# Patient Record
Sex: Female | Born: 2000 | Race: White | Hispanic: No | Smoking: Former smoker
Health system: Southern US, Community
[De-identification: ages and names within clinical notes are randomized; demographics above are authoritative.]

## PROBLEM LIST (undated history)

## (undated) DIAGNOSIS — J45909 Unspecified asthma, uncomplicated: Secondary | ICD-10-CM

## (undated) DIAGNOSIS — F32A Depression, unspecified: Secondary | ICD-10-CM

## (undated) DIAGNOSIS — F329 Major depressive disorder, single episode, unspecified: Secondary | ICD-10-CM

## (undated) DIAGNOSIS — T7840XA Allergy, unspecified, initial encounter: Secondary | ICD-10-CM

## (undated) DIAGNOSIS — O139 Gestational [pregnancy-induced] hypertension without significant proteinuria, unspecified trimester: Secondary | ICD-10-CM

## (undated) DIAGNOSIS — E079 Disorder of thyroid, unspecified: Secondary | ICD-10-CM

## (undated) DIAGNOSIS — G4730 Sleep apnea, unspecified: Secondary | ICD-10-CM

## (undated) DIAGNOSIS — E039 Hypothyroidism, unspecified: Secondary | ICD-10-CM

## (undated) DIAGNOSIS — F419 Anxiety disorder, unspecified: Secondary | ICD-10-CM

## (undated) HISTORY — DX: Sleep apnea, unspecified: G47.30

## (undated) HISTORY — DX: Gestational (pregnancy-induced) hypertension without significant proteinuria, unspecified trimester: O13.9

## (undated) HISTORY — DX: Hypothyroidism, unspecified: E03.9

---
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
## 2022-01-28 ENCOUNTER — Encounter: Payer: Self-pay | Admitting: Nurse Practitioner

## 2022-01-28 ENCOUNTER — Ambulatory Visit (INDEPENDENT_AMBULATORY_CARE_PROVIDER_SITE_OTHER): Payer: Medicaid Other | Admitting: Nurse Practitioner

## 2022-01-28 VITALS — BP 113/78 | HR 113 | Temp 98.8°F | Ht 63.0 in | Wt 277.0 lb

## 2022-01-28 DIAGNOSIS — J309 Allergic rhinitis, unspecified: Secondary | ICD-10-CM

## 2022-01-28 DIAGNOSIS — E039 Hypothyroidism, unspecified: Secondary | ICD-10-CM

## 2022-01-28 DIAGNOSIS — F322 Major depressive disorder, single episode, severe without psychotic features: Secondary | ICD-10-CM | POA: Diagnosis not present

## 2022-01-28 DIAGNOSIS — F419 Anxiety disorder, unspecified: Secondary | ICD-10-CM | POA: Diagnosis not present

## 2022-01-28 MED ORDER — BUSPIRONE HCL 7.5 MG PO TABS: 7.5000 mg | ORAL_TABLET | Freq: Two times a day (BID) | ORAL | 6 refills | Status: DC

## 2022-01-28 MED ORDER — ESCITALOPRAM OXALATE 20 MG PO TABS
20.0000 mg | ORAL_TABLET | Freq: Every day | ORAL | 5 refills | Status: DC
Start: 2022-01-28 — End: 2022-04-16

## 2022-01-28 MED ORDER — FLUTICASONE PROPIONATE 50 MCG/ACT NA SUSP: 1.0000 | Freq: Every day | NASAL | 5 refills | Status: AC

## 2022-01-28 NOTE — Assessment & Plan Note (Signed)
Depression not well controlled.  Completed PHQ-9.  Increase Lexapro from 10 mg tablet by mouth daily to 20 mg tablet by mouth daily.  Printed handouts given.  Follow-up in 6 to 12 weeks.

## 2022-01-28 NOTE — Assessment & Plan Note (Signed)
Completed TSH results pending.

## 2022-01-28 NOTE — Progress Notes (Signed)
Established Patient Office Visit  Subjective:  Patient ID: Joanne [name], female    DOB: [birth year]  Age: 21 y.o. MRN: [id number]  CC:  Chief Complaint  Patient presents with   Thyroid Problem    HPI Joanne [name] presents for Thyroid: Patient presents for evaluation of hypothyroidism. Current symptoms include fatigue, weight gain, anxiousness, depression. Patient denies constipation, feeling excessive energy, weight loss.    Anxiety, Follow-up  She was last seen for anxiety 4 months ago. Changes made at last visit include Lexapro 10 mg tablet by mouth, Busper 5 mg tablet  by mouth.   She reports good compliance with treatment. She reports good tolerance of treatment. She is not having side effects.  Anxiety not controlled  She feels her anxiety is severe and Worse since last visit.  Symptoms: No chest pain Yes difficulty concentrating  No dizziness Yes fatigue  Yes feelings of losing control Yes insomnia  Yes irritable No palpitations  Yes panic attacks No racing thoughts  No shortness of breath No sweating  No tremors/shakes    GAD-7 Results GAD-7 Generalized Anxiety Disorder Screening Tool 01/28/2022 10/17/2021 09/03/2021  1. Feeling Nervous, Anxious, or on Edge '3 3 3  ' 2. Not Being Able to Stop or Control Worrying '3 3 3  ' 3. Worrying Too Much About Different Things '3 3 3  ' 4. Trouble Relaxing '3 2 3  ' 5. Being So Restless it's Hard To Sit Still '1 2 3  ' 6. Becoming Easily Annoyed or Irritable '3 3 3  ' 7. Feeling Afraid As If Something Awful Might Happen '2 3 3  ' Total GAD-7 Score '18 19 21  ' Difficulty At Work, Home, or Getting  Along With Others? Extremely difficult Extremely difficult Somewhat difficult       Depression, Follow-up  She  was last seen for this 4 months ago. Changes made at last visit include Lexapro 10 mg tablet daily.   She reports good compliance with treatment. She is not having side effects.  Depression not well controlled  She reports good  tolerance of treatment. Current symptoms include: depressed mood, difficulty concentrating, fatigue, hopelessness, and insomnia She feels she is Worse since last visit.  Depression screen [facility] 2/9 01/28/2022 10/17/2021 09/03/2021  Decreased Interest '3 2 3  ' Down, Depressed, Hopeless '3 2 3  ' PHQ - 2 Score '6 4 6  ' Altered sleeping '2 3 3  ' Tired, decreased energy '3 2 3  ' Change in appetite '3 3 3  ' Feeling bad or failure about yourself  '2 2 2  ' Trouble concentrating '3 3 3  ' Moving slowly or fidgety/restless '1 1 3  ' Suicidal thoughts 0 0 2  PHQ-9 Score '20 18 25  ' Difficult doing work/chores Extremely dIfficult Very difficult Somewhat difficult  Some encounter information is confidential and restricted. Go to Review Flowsheets activity to see all data.      Past Medical History:  Diagnosis Date   Allergy    Anxiety    Asthma    Depression    Sleep apnea    Thyroid disease     Past Surgical History:  Procedure Laterality Date   BREAST REDUCTION SURGERY Bilateral 08/07/2020   Procedure: BILATERAL MAMMARY REDUCTION  (BREAST);  Surgeon: Cindra Presume, MD;  Location: [facility];  Service: Plastics;  Laterality: Bilateral;  2.5 hours, please    Family History  Problem Relation Age of Onset   Stroke Mother    Heart disease Mother    Asthma Mother    Arthritis  Mother    Anxiety disorder Mother    Alcohol abuse Mother    Drug abuse Mother    Hypertension Father    Alcohol abuse Father    Drug abuse Father    Bipolar disorder Sister    Drug abuse Sister    Depression Sister    Asthma Sister    Anxiety disorder Sister    Alcohol abuse Sister    ADD / ADHD Sister    Drug abuse Brother    Depression Brother    Alcohol abuse Brother    Drug abuse Maternal Grandfather    Cancer Maternal Grandfather        oral/throat   Alcohol abuse Maternal Grandfather    Drug abuse Maternal Grandmother    Cancer Maternal Grandmother        lung   Hyperlipidemia Paternal Grandfather     Diabetes Paternal Grandfather    Drug abuse Paternal Grandmother     Social History   Socioeconomic History   Marital status: Significant Other    Spouse name: Not on file   Number of children: 0   Years of education: 12   Highest education level: High school graduate  Occupational History   Not on file  Tobacco Use   Smoking status: Former    Packs/day: 1.00    Years: 4.00    Pack years: 4.00    Types: Cigarettes    Quit date: 2019    Years since quitting: 4.1   Smokeless tobacco: Never  Vaping Use   Vaping Use: Former   Quit date: 10/11/2021   Substances: Nicotine, Flavoring  Substance and Sexual Activity   Alcohol use: Not Currently   Drug use: Yes    Types: Marijuana    Comment: once or twice per week - 2 blunts per week   Sexual activity: Yes    Birth control/protection: Condom  Other Topics Concern   Not on file  Social History Narrative   Not on file   Social Determinants of Health   Financial Resource Strain: Not on file  Food Insecurity: Not on file  Transportation Needs: Not on file  Physical Activity: Not on file  Stress: Not on file  Social Connections: Not on file  Intimate Partner Violence: Not on file    Outpatient Medications Prior to Visit  Medication Sig Dispense Refill   albuterol (VENTOLIN HFA) 108 (90 Base) MCG/ACT inhaler Inhale 2 puffs into the lungs every 4 (four) hours as needed for wheezing or shortness of breath. 8 g 0   levocetirizine (XYZAL) 5 MG tablet TAKE 1 TABLET BY MOUTH DAILY 30 tablet 5   levothyroxine (SYNTHROID, LEVOTHROID) 50 MCG tablet Take 1 tablet by mouth daily.     norgestimate-ethinyl estradiol (ORTHO-CYCLEN) 0.25-35 MG-MCG tablet Take 1 tablet by mouth daily.     busPIRone (BUSPAR) 5 MG tablet Take 1 tablet (5 mg total) by mouth 2 (two) times daily. 30 tablet 0   escitalopram (LEXAPRO) 10 MG tablet Take 1 tablet (10 mg total) by mouth daily. 30 tablet 5   fluticasone (FLONASE) 50 MCG/ACT nasal spray Place 1  spray into both nostrils daily. 16 g 5   ondansetron (ZOFRAN) 4 MG tablet Take 1 tablet (4 mg total) by mouth every 8 (eight) hours as needed for nausea or vomiting. 20 tablet 0   No facility-administered medications prior to visit.    Allergies  Allergen Reactions   Red Dye Rash    ROS Review of Systems  Constitutional: Negative.   HENT: Negative.    Eyes: Negative.   Respiratory: Negative.    Cardiovascular: Negative.   Gastrointestinal: Negative.   Musculoskeletal: Negative.   Skin: Negative.   Psychiatric/Behavioral:  Negative for sleep disturbance and suicidal ideas. The patient is nervous/anxious.   All other systems reviewed and are negative.    Objective:    Physical Exam Vitals and nursing note reviewed.  Constitutional:      Appearance: She is obese.  HENT:     Head: Normocephalic.     Right Ear: External ear normal.     Left Ear: External ear normal.     Nose: Nose normal.     Mouth/Throat:     Mouth: Mucous membranes are moist.     Pharynx: Oropharynx is clear.  Eyes:     Conjunctiva/sclera: Conjunctivae normal.  Cardiovascular:     Pulses: Normal pulses.     Heart sounds: Normal heart sounds.  Pulmonary:     Effort: Pulmonary effort is normal.     Breath sounds: Normal breath sounds.  Abdominal:     General: Bowel sounds are normal.  Skin:    General: Skin is warm.     Findings: No rash.  Neurological:     Mental Status: She is oriented to person, place, and time.  Psychiatric:        Attention and Perception: Attention and perception normal.        Mood and Affect: Mood is anxious and depressed.        Speech: Speech normal.        Behavior: Behavior normal.    BP 113/78    Pulse (!) 113    Temp 98.8 F (37.1 C)    Ht '5\' 3"'  (1.6 m)    Wt 277 lb (125.6 kg)    LMP 12/26/2021 (Approximate)    SpO2 95%    BMI 49.07 kg/m  Wt Readings from Last 3 Encounters:  01/28/22 277 lb (125.6 kg) (>99 %, Z= 2.64)*  10/17/21 272 lb 12.8 oz (123.7 kg) (>99  %, Z= 2.60)*  09/03/21 277 lb 2 oz (125.7 kg) (>99 %, Z= 2.62)*   * Growth percentiles are based on CDC (Girls, 2-20 Years) data.     There are no preventive care reminders to display for this patient.  There are no preventive care reminders to display for this patient.  Lab Results  Component Value Date   TSH 4.181 02/01/2021   Lab Results  Component Value Date   WBC 10.1 09/03/2021   HGB 14.1 09/03/2021   HCT 42.0 09/03/2021   MCV 92 09/03/2021   PLT 336 09/03/2021   Lab Results  Component Value Date   NA 142 09/03/2021   K 4.1 09/03/2021   CO2 22 09/03/2021   GLUCOSE 71 09/03/2021   BUN 13 09/03/2021   CREATININE 0.59 09/03/2021   BILITOT 0.2 09/03/2021   ALKPHOS 93 09/03/2021   AST 21 09/03/2021   ALT 39 (H) 09/03/2021   PROT 7.3 09/03/2021   ALBUMIN 4.4 09/03/2021   CALCIUM 10.1 09/03/2021   ANIONGAP 12 02/01/2021   EGFR 134 09/03/2021   Lab Results  Component Value Date   CHOL 161 09/03/2021   Lab Results  Component Value Date   HDL 45 09/03/2021   Lab Results  Component Value Date   LDLCALC 93 09/03/2021   Lab Results  Component Value Date   TRIG 127 (H) 09/03/2021   Lab Results  Component Value  Date   CHOLHDL 3.6 09/03/2021   No results found for: HGBA1C    Assessment & Plan:   Problem List Items Addressed This Visit       Respiratory   Allergic rhinitis   Relevant Medications   fluticasone (FLONASE) 50 MCG/ACT nasal spray     Endocrine   Hypothyroidism - Primary    Completed TSH results pending.      Relevant Orders   TSH     Other   Anxiety    .Anxiety not well controlled increase BuSpar from 5 mg tablet by mouth daily to 7.5 mg tablet by mouth daily.  Education completed printed handouts given.  Complete the GAD-7.  Rx and pharmacy.  Follow-up in 6 to 12 weeks.      Relevant Medications   busPIRone (BUSPAR) 7.5 MG tablet   escitalopram (LEXAPRO) 20 MG tablet   Major depressive disorder, severe (HCC)    Depression  not well controlled.  Completed PHQ-9.  Increase Lexapro from 10 mg tablet by mouth daily to 20 mg tablet by mouth daily.  Printed handouts given.  Follow-up in 6 to 12 weeks.      Relevant Medications   busPIRone (BUSPAR) 7.5 MG tablet   escitalopram (LEXAPRO) 20 MG tablet    Meds ordered this encounter  Medications   fluticasone (FLONASE) 50 MCG/ACT nasal spray    Sig: Place 1 spray into both nostrils daily.    Dispense:  16 g    Refill:  5   busPIRone (BUSPAR) 7.5 MG tablet    Sig: Take 1 tablet (7.5 mg total) by mouth 2 (two) times daily.    Dispense:  30 tablet    Refill:  6    Order Specific Question:   Supervising Provider    Answer:   Claretta Fraise [[id number]]   escitalopram (LEXAPRO) 20 MG tablet    Sig: Take 1 tablet (20 mg total) by mouth daily.    Dispense:  30 tablet    Refill:  5    Order Specific Question:   Supervising Provider    Answer:   Claretta Fraise [id number]    Follow-up: Return in about 6 weeks (around 03/11/2022) for depression, 6 months follow up for Hypothyroid.    Ivy Lynn, NP

## 2022-01-28 NOTE — Assessment & Plan Note (Signed)
.  Anxiety not well controlled increase BuSpar from 5 mg tablet by mouth daily to 7.5 mg tablet by mouth daily.  Education completed printed handouts given.  Complete the GAD-7.  Rx and pharmacy.  Follow-up in 6 to 12 weeks.

## 2022-01-28 NOTE — Patient Instructions (Addendum)
Generalized Anxiety Disorder, Adult °Generalized anxiety disorder (GAD) is a mental health condition. Unlike normal worries, anxiety related to GAD is not triggered by a specific event. These worries do not fade or get better with time. GAD interferes with relationships, work, and school. °GAD symptoms can vary from mild to severe. People with severe GAD can have intense waves of anxiety with physical symptoms that are similar to panic attacks. °What are the causes? °The exact cause of GAD is not known, but the following are believed to have an impact: °Differences in natural brain chemicals. °Genes passed down from parents to children. °Differences in the way threats are perceived. °Development and stress during childhood. °Personality. °What increases the risk? °The following factors may make you more likely to develop this condition: °Being female. °Having a family history of anxiety disorders. °Being very shy. °Experiencing very stressful life events, such as the death of a loved one. °Having a very stressful family environment. °What are the signs or symptoms? °People with GAD often worry excessively about many things in their lives, such as their health and family. Symptoms may also include: °Mental and emotional symptoms: °Worrying excessively about natural disasters. °Fear of being late. °Difficulty concentrating. °Fears that others are judging your performance. °Physical symptoms: °Fatigue. °Headaches, muscle tension, muscle twitches, trembling, or feeling shaky. °Feeling like your heart is pounding or beating very fast. °Feeling out of breath or like you cannot take a deep breath. °Having trouble falling asleep or staying asleep, or experiencing restlessness. °Sweating. °Nausea, diarrhea, or irritable bowel syndrome (IBS). °Behavioral symptoms: °Experiencing erratic moods or irritability. °Avoidance of new situations. °Avoidance of people. °Extreme difficulty making decisions. °How is this diagnosed? °This  condition is diagnosed based on your symptoms and medical history. You will also have a physical exam. Your health care provider may perform tests to rule out other possible causes of your symptoms. °To be diagnosed with GAD, a person must have anxiety that: °Is out of his or her control. °Affects several different aspects of his or her life, such as work and relationships. °Causes distress that makes him or her unable to take part in normal activities. °Includes at least three symptoms of GAD, such as restlessness, fatigue, trouble concentrating, irritability, muscle tension, or sleep problems. °Before your health care provider can confirm a diagnosis of GAD, these symptoms must be present more days than they are not, and they must last for 6 months or longer. °How is this treated? °This condition may be treated with: °Medicine. Antidepressant medicine is usually prescribed for long-term daily control. Anti-anxiety medicines may be added in severe cases, especially when panic attacks occur. °Talk therapy (psychotherapy). Certain types of talk therapy can be helpful in treating GAD by providing support, education, and guidance. Options include: °Cognitive behavioral therapy (CBT). People learn coping skills and self-calming techniques to ease their physical symptoms. They learn to identify unrealistic thoughts and behaviors and to replace them with more appropriate thoughts and behaviors. °Acceptance and commitment therapy (ACT). This treatment teaches people how to be mindful as a way to cope with unwanted thoughts and feelings. °Biofeedback. This process trains you to manage your body's response (physiological response) through breathing techniques and relaxation methods. You will work with a therapist while machines are used to monitor your physical symptoms. °Stress management techniques. These include yoga, meditation, and exercise. °A mental health specialist can help determine which treatment is best for you.  Some people see improvement with one type of therapy. However, other people require   a combination of therapies. Follow these instructions at home: Lifestyle Maintain a consistent routine and schedule. Anticipate stressful situations. Create a plan and allow extra time to work with your plan. Practice stress management or self-calming techniques that you have learned from your therapist or your health care provider. Exercise regularly and spend time outdoors. Eat a healthy diet that includes plenty of vegetables, fruits, whole grains, low-fat dairy products, and lean protein. Do not eat a lot of foods that are high in fat, added sugar, or salt (sodium). Drink plenty of water. Avoid alcohol. Alcohol can increase anxiety. Avoid caffeine and certain over-the-counter cold medicines. These may make you feel worse. Ask your pharmacist which medicines to avoid. General instructions Take over-the-counter and prescription medicines only as told by your health care provider. Understand that you are likely to have setbacks. Accept this and be kind to yourself as you persist to take better care of yourself. Anticipate stressful situations. Create a plan and allow extra time to work with your plan. Recognize and accept your accomplishments, even if you judge them as small. Spend time with people who care about you. Keep all follow-up visits. This is important. Where to find more information [place]: https://carter.com/ Substance Abuse and Mental Health Services: ktimeonline.com Contact a health care provider if: Your symptoms do not get better. Your symptoms get worse. You have signs of depression, such as: A persistently sad or irritable mood. Loss of enjoyment in activities that used to bring you joy. Change in weight or eating. Changes in sleeping habits. Get help right away if: You have thoughts about hurting yourself or others. If you ever feel like you may hurt  yourself or others, or have thoughts about taking your own life, get help right away. Go to your nearest emergency department or: Call your local emergency services (911 in the [place] Call a suicide crisis helpline, such as the [place] at 469-035-7174 or 988 in the [place]. This is open 24 hours a day in the [place] Text the Crisis Text Line at 618-864-1192 (in the [place].). Summary Generalized anxiety disorder (GAD) is a mental health condition that involves worry that is not triggered by a specific event. People with GAD often worry excessively about many things in their lives, such as their health and family. GAD may cause symptoms such as restlessness, trouble concentrating, sleep problems, frequent sweating, nausea, diarrhea, headaches, and trembling or muscle twitching. A mental health specialist can help determine which treatment is best for you. Some people see improvement with one type of therapy. However, other people require a combination of therapies. This information is not intended to replace advice given to you by your health care provider. Make sure you discuss any questions you have with your health care provider. Document Revised: 07/04/2021 Document Reviewed: 04/01/2021 Elsevier Patient Education  [place]. Major Depressive Disorder, Adult Major depressive disorder (MDD) is a mental health condition. It may also be called clinical depression or unipolar depression. MDD causes symptoms of sadness, hopelessness, and loss of interest in things. These symptoms last most of the day, almost every day, for 2 weeks. MDD can also cause physical symptoms. It can interfere with relationships and with everyday activities, such as work, school, and activities that are usually pleasant. MDD may be mild, moderate, or severe. It may be single-episode MDD, which happens once, or recurrent MDD, which may occur multiple times. What are the causes? The exact cause of this  condition is  not known. MDD is most likely caused by a combination of things, which may include: Your personality traits. Learned or conditioned behaviors or thoughts or feelings that reinforce negativity. Any alcohol or substance misuse. Long-term (chronic) physical or mental health illness. Going through a traumatic experience or major life changes. What increases the risk? The following factors may make someone more likely to develop MDD: A family history of depression. Being a woman. Troubled family relationships. Abnormally low levels of certain brain chemicals. Traumatic or painful events in childhood, especially abuse or loss of a parent. A lot of stress from life experiences, such as poor living conditions or discrimination. Chronic physical illness or other mental health disorders. What are the signs or symptoms? The main symptoms of MDD usually include: Constant depressed or irritable mood. A loss of interest in things and activities. Other symptoms include: Sleeping or eating too much or too little. Unexplained weight gain or weight loss. Tiredness or low energy. Being agitated, restless, or weak. Feeling hopeless, worthless, or guilty. Trouble thinking clearly or making decisions. Thoughts of suicide or thoughts of harming others. Isolating oneself or avoiding other people or activities. Trouble completing tasks, work, or any normal obligations. Severe symptoms of this condition may include: Psychotic depression.This may include false beliefs, or delusions. It may also include seeing, hearing, tasting, smelling, or feeling things that are not real (hallucinations). Chronic depression or persistent depressive disorder. This is low-level depression that lasts for at least 2 years. Melancholic depression, or feeling extremely sad and hopeless. Catatonic depression, which includes trouble speaking and trouble moving. How is this diagnosed? This condition may be diagnosed  based on: Your symptoms. Your medical and mental health history. You may be asked questions about your lifestyle, including any drug and alcohol use. A physical exam. Blood tests to rule out other conditions. MDD is confirmed if you have the following symptoms most of the day, nearly every day, in a 2-week period: Either a depressed mood or loss of interest. At least four other MDD symptoms. How is this treated? This condition is usually treated by mental health professionals, such as psychologists, psychiatrists, and clinical social workers. You may need more than one type of treatment. Treatment may include: Psychotherapy, also called talk therapy or counseling. Types of psychotherapy include: Cognitive behavioral therapy (CBT). This teaches you to recognize unhealthy feelings, thoughts, and behaviors, and replace them with positive thoughts and actions. Interpersonal therapy (IPT). This helps you to improve the way you communicate with others or relate to them. Family therapy. This treatment includes members of your family. Medicines to treat anxiety and depression. These medicines help to balance the brain chemicals that affect your emotions. Lifestyle changes. You may be asked to: Limit alcohol use and avoid drug use. Get regular exercise. Get plenty of sleep. Make healthy eating choices. Spend more time outdoors. Brain stimulation. This may be done if symptoms are very severe and other treatments have not worked. Examples of this treatment are electroconvulsive therapy and transcranial magnetic stimulation. Follow these instructions at home: Activity Exercise regularly and spend time outdoors. Find activities that you enjoy doing, and make time to do them. Find healthy ways to manage stress, such as: Meditation or deep breathing. Spending time in nature. Journaling. Return to your normal activities as told by your health care provider. Ask your health care provider what activities  are safe for you. Alcohol and drug use If you drink alcohol: Limit how much you use to: 0-1 drink a day for  women who are not pregnant. 0-2 drinks a day for men. Be aware of how much alcohol is in your drink. In the [place] one drink equals one 12 oz bottle of beer (355 mL), one 5 oz glass of wine (148 mL), or one 1 oz glass of hard liquor (44 mL). Discuss your alcohol use with your health care provider. Alcohol can affect any antidepressant medicines you are taking. Discuss any drug use with your health care provider. General instructions  Take over-the-counter and prescription medicines only as told by your health care provider. Eat a healthy diet and get plenty of sleep. Consider joining a support group. Your health care provider may be able to recommend one. Keep all follow-up visits as told by your health care provider. This is important. Where to find more information [employer] on Mental Illness: www.nami.[place]: https://carter.com/ Contact a health care provider if: Your symptoms get worse. You develop new symptoms. Get help right away if: You self-harm. You have serious thoughts about hurting yourself or others. You hallucinate. If you ever feel like you may hurt yourself or others, or have thoughts about taking your own life, get help right away. Go to your nearest emergency department or: Call your local emergency services (911 in the [place] Call a suicide crisis helpline, such as the [place] at 367-246-2758 or 988 in the [place]. This is open 24 hours a day in the [place] Text the Crisis Text Line at 670-291-5766 (in the [place].). Summary Major depressive disorder (MDD) is a mental health condition. MDD causes symptoms of sadness, hopelessness, and loss of interest in things. These symptoms last most of the day, almost every day, for 2 weeks. The symptoms of MDD can interfere with relationships and with everyday  activities. Treatments and support are available for people who develop MDD. You may need more than one type of treatment. Get help right away if you have serious thoughts about hurting yourself or others. This information is not intended to replace advice given to you by your health care provider. Make sure you discuss any questions you have with your health care provider. Document Revised: 07/04/2021 Document Reviewed: 11/20/2019 Elsevier Patient Education  2022 [place]. Hypothyroidism Hypothyroidism is when the thyroid gland does not make enough of certain hormones (it is underactive). The thyroid gland is a small gland located in the lower front part of the neck, just in front of the windpipe (trachea). This gland makes hormones that help control how the body uses food for energy (metabolism) as well as how the heart and brain function. These hormones also play a role in keeping your bones strong. When the thyroid is underactive, it produces too little of the hormones thyroxine (T4) and triiodothyronine (T3). What are the causes? This condition may be caused by: Hashimoto's disease. This is a disease in which the body's disease-fighting system (immune system) attacks the thyroid gland. This is the most common cause. Viral infections. Pregnancy. Certain medicines. Birth defects. Past radiation treatments to the head or neck for cancer. Past treatment with radioactive iodine. Past exposure to radiation in the environment. Past surgical removal of part or all of the thyroid. Problems with a gland in the center of the brain (pituitary gland). Lack of enough iodine in the diet. What increases the risk? You are more likely to develop this condition if: You are female. You have a family history of thyroid conditions. You use a medicine called lithium. You  take medicines that affect the immune system (immunosuppressants). What are the signs or symptoms? Symptoms of this condition  include: Feeling as though you have no energy (lethargy). Not being able to tolerate cold. Weight gain that is not explained by a change in diet or exercise habits. Lack of appetite. Dry skin. Coarse hair. Menstrual irregularity. Slowing of thought processes. Constipation. Sadness or depression. How is this diagnosed? This condition may be diagnosed based on: Your symptoms, your medical history, and a physical exam. Blood tests. You may also have imaging tests, such as an ultrasound or MRI. How is this treated? This condition is treated with medicine that replaces the thyroid hormones that your body does not make. After you begin treatment, it may take several weeks for symptoms to go away. Follow these instructions at home: Take over-the-counter and prescription medicines only as told by your health care provider. If you start taking any new medicines, tell your health care provider. Keep all follow-up visits as told by your health care provider. This is important. As your condition improves, your dosage of thyroid hormone medicine may change. You will need to have blood tests regularly so that your health care provider can monitor your condition. Contact a health care provider if: Your symptoms do not get better with treatment. You are taking thyroid hormone replacement medicine and you: Sweat a lot. Have tremors. Feel anxious. Lose weight rapidly. Cannot tolerate heat. Have emotional swings. Have diarrhea. Feel weak. Get help right away if you have: Chest pain. An irregular heartbeat. A rapid heartbeat. Difficulty breathing. Summary Hypothyroidism is when the thyroid gland does not make enough of certain hormones (it is underactive). When the thyroid is underactive, it produces too little of the hormones thyroxine (T4) and triiodothyronine (T3). The most common cause is Hashimoto's disease, a disease in which the body's disease-fighting system (immune system) attacks the  thyroid gland. The condition can also be caused by viral infections, medicine, pregnancy, or past radiation treatment to the head or neck. Symptoms may include weight gain, dry skin, constipation, feeling as though you do not have energy, and not being able to tolerate cold. This condition is treated with medicine to replace the thyroid hormones that your body does not make. This information is not intended to replace advice given to you by your health care provider. Make sure you discuss any questions you have with your health care provider. Document Revised: 08/16/2021 Document Reviewed: 08/24/2020 Elsevier Patient Education  2022 [employer].

## 2022-01-29 ENCOUNTER — Other Ambulatory Visit: Payer: Self-pay | Admitting: Nurse Practitioner

## 2022-01-29 LAB — TSH: TSH: 1.93 u[IU]/mL (ref 0.450–4.500)

## 2022-01-29 MED ORDER — LEVOTHYROXINE SODIUM 50 MCG PO TABS: 50.0000 ug | ORAL_TABLET | Freq: Every day | ORAL | 1 refills | Status: DC

## 2022-01-31 ENCOUNTER — Telehealth: Payer: Self-pay | Admitting: *Deleted

## 2022-01-31 NOTE — Telephone Encounter (Signed)
Informed pt that I received information from her Aunt through her aunt's mychart but was not able to reply since this was about her. Not able to call the Aunt about this information since she is not listed on the patient's contact list even thought she has legal guardianship. Discussed with pt that ppw that was received about sleep information and needing CPAP machine, after discussion with Je her provider that in reading notes from [facility] and the severity of her OSA it would be in her best interest to follow-up with their recommendation of seeing Dr. Darrol Jump with [facility]. Pt verbalized understanding and will let her aunt know.

## 2022-02-16 ENCOUNTER — Other Ambulatory Visit: Payer: Self-pay | Admitting: Pediatrics

## 2022-02-25 ENCOUNTER — Other Ambulatory Visit: Payer: Self-pay | Admitting: Nurse Practitioner

## 2022-02-25 NOTE — Telephone Encounter (Signed)
Last office visit 01/28/22 ?On med list but never prescribed here before ?

## 2022-03-11 ENCOUNTER — Ambulatory Visit: Payer: Medicaid Other | Admitting: Nurse Practitioner

## 2022-04-09 ENCOUNTER — Ambulatory Visit: Payer: Medicaid Other | Admitting: Family Medicine

## 2022-04-12 ENCOUNTER — Ambulatory Visit: Payer: Medicaid Other | Admitting: Family Medicine

## 2022-04-16 ENCOUNTER — Encounter: Payer: Self-pay | Admitting: Family Medicine

## 2022-04-16 ENCOUNTER — Ambulatory Visit (INDEPENDENT_AMBULATORY_CARE_PROVIDER_SITE_OTHER): Payer: Medicaid Other | Admitting: Family Medicine

## 2022-04-16 ENCOUNTER — Other Ambulatory Visit (HOSPITAL_COMMUNITY)
Admission: RE | Admit: 2022-04-16 | Discharge: 2022-04-16 | Disposition: A | Discharge status: Home / Self Care | Payer: Medicaid Other | Source: Ambulatory Visit | Attending: Family Medicine | Admitting: Family Medicine

## 2022-04-16 VITALS — BP 111/66 | HR 86 | Temp 98.7°F | Ht 63.0 in | Wt 273.0 lb

## 2022-04-16 DIAGNOSIS — Z7251 High risk heterosexual behavior: Secondary | ICD-10-CM | POA: Diagnosis present

## 2022-04-16 DIAGNOSIS — Z3201 Encounter for pregnancy test, result positive: Secondary | ICD-10-CM

## 2022-04-16 LAB — PREGNANCY, URINE: Preg Test, Ur: POSITIVE — AB

## 2022-04-16 MED ORDER — PRENATAL + COMPLETE MULTI 0.267 & 373 MG PO THPK: 1.0000 | PACK | Freq: Every day | ORAL | 3 refills | Status: DC

## 2022-04-16 NOTE — Progress Notes (Signed)
?  ? ?Subjective:  ?Patient ID: Joanne [name], female    DOB: 12-Nov-2001, [age] y.o.   MRN: [id number] ? ?Patient Care Team: ?Daryll DrownIjaola, Onyeje M, NP as PCP - General (Nurse Practitioner) ?Brayton MarsWalsh, Marion Elizabeth Tharpe, MD (Pediatrics) ?Freddie ApleyBonner, Sheri N. as Speech Language Pathologist (Speech Pathology) ?Vella KohlerQayumi, Zainab S, MD as Referring Physician (Pediatrics)  ? ?Chief Complaint:  Possible Pregnancy ? ? ?HPI: ?Joanne [name] is a 21 y.o. female presenting on 04/16/2022 for Possible Pregnancy ? ? ?Pt presents today for pregnancy test. States her first day of LMP was 02/23/2022. She has taken 3 home pregnancy tests with positive results. She has stopped her lexapro and buspar. States she has been doing well since stopping medications.  ? ?Possible Pregnancy ?This is a new problem. Episode onset: First Day of LMP 02/23/2022. Associated symptoms include nausea. Pertinent negatives include no abdominal pain, anorexia, arthralgias, chest pain, chills, coughing, fatigue, fever, headaches, myalgias, urinary symptoms or vomiting. Associated symptoms comments: Missed menses, unprotected sex at least twice weekly. No vaginal discharge, pain, or bleeding.  ? ?Relevant past medical, surgical, family, and social history reviewed and updated as indicated.  ?Allergies and medications reviewed and updated. Data reviewed: Chart in Epic. ? ? ?Past Medical History:  ?Diagnosis Date  ? Allergy   ? Anxiety   ? Asthma   ? Depression   ? Sleep apnea   ? Thyroid disease   ? ? ?Past Surgical History:  ?Procedure Laterality Date  ? BREAST REDUCTION SURGERY Bilateral 08/07/2020  ? Procedure: BILATERAL MAMMARY REDUCTION  (BREAST);  Surgeon: Allena NapoleonPace, Collier S, MD;  Location: [facility];  Service: Plastics;  Laterality: Bilateral;  2.5 hours, please  ? ? ?Social History  ? ?Socioeconomic History  ? Marital status: Significant Other  ?  Spouse name: Not on file  ? Number of children: 0  ? Years of education: 6512  ? Highest education level: High  school graduate  ?Occupational History  ? Not on file  ?Tobacco Use  ? Smoking status: Former  ?  Packs/day: 1.00  ?  Years: 4.00  ?  Pack years: 4.00  ?  Types: Cigarettes  ?  Quit date: 2019  ?  Years since quitting: 4.3  ? Smokeless tobacco: Never  ?Vaping Use  ? Vaping Use: Former  ? Quit date: 10/11/2021  ? Substances: Nicotine, Flavoring  ?Substance and Sexual Activity  ? Alcohol use: Not Currently  ? Drug use: Yes  ?  Types: Marijuana  ?  Comment: once or twice per week - 2 blunts per week  ? Sexual activity: Yes  ?  Birth control/protection: Condom  ?Other Topics Concern  ? Not on file  ?Social History Narrative  ? Not on file  ? ?Social Determinants of Health  ? ?Financial Resource Strain: Not on file  ?Food Insecurity: Not on file  ?Transportation Needs: Not on file  ?Physical Activity: Not on file  ?Stress: Not on file  ?Social Connections: Not on file  ?Intimate Partner Violence: Not on file  ? ? ?Outpatient Encounter Medications as of 04/16/2022  ?Medication Sig  ? albuterol (VENTOLIN HFA) 108 (90 Base) MCG/ACT inhaler Inhale 2 puffs into the lungs every 4 (four) hours as needed for wheezing or shortness of breath.  ? fluticasone (FLONASE) 50 MCG/ACT nasal spray Place 1 spray into both nostrils daily.  ? levothyroxine (SYNTHROID) 50 MCG tablet Take 1 tablet (50 mcg total) by mouth daily.  ? Prenat-Methylfol-Chol-Fish Oil (PRENATAL + COMPLETE MULTI) 0.267 &  373 MG THPK Take 1 tablet by mouth daily.  ? [DISCONTINUED] busPIRone (BUSPAR) 7.5 MG tablet Take 1 tablet (7.5 mg total) by mouth 2 (two) times daily.  ? [DISCONTINUED] escitalopram (LEXAPRO) 20 MG tablet Take 1 tablet (20 mg total) by mouth daily.  ? [DISCONTINUED] levocetirizine (XYZAL) 5 MG tablet TAKE 1 TABLET BY MOUTH DAILY  ? [DISCONTINUED] norgestimate-ethinyl estradiol (ORTHO-CYCLEN) 0.25-35 MG-MCG tablet TAKE 1 TABLET BY MOUTH DAILY  ? ?No facility-administered encounter medications on file as of 04/16/2022.  ? ? ?Allergies  ?Allergen  Reactions  ? Red Dye Rash  ? ? ?Review of Systems  ?Constitutional:  Negative for activity change, appetite change, chills, fatigue and fever.  ?HENT: Negative.    ?Eyes: Negative.   ?Respiratory:  Negative for cough, chest tightness and shortness of breath.   ?Cardiovascular:  Negative for chest pain, palpitations and leg swelling.  ?Gastrointestinal:  Positive for nausea. Negative for abdominal pain, anorexia, blood in stool, constipation, diarrhea and vomiting.  ?Endocrine: Negative.   ?Genitourinary:  Positive for menstrual problem. Negative for decreased urine volume, difficulty urinating, dyspareunia, dysuria, enuresis, flank pain, frequency, genital sores, hematuria, pelvic pain, urgency, vaginal bleeding, vaginal discharge and vaginal pain.  ?Musculoskeletal:  Negative for arthralgias and myalgias.  ?Skin: Negative.   ?Allergic/Immunologic: Negative.   ?Neurological:  Negative for dizziness and headaches.  ?Hematological: Negative.   ?Psychiatric/Behavioral:  Negative for confusion, hallucinations, sleep disturbance and suicidal ideas.   ?All other systems reviewed and are negative. ? ?   ? ?Objective:  ?BP 111/66   Pulse 86   Temp 98.7 ?F (37.1 ?C)   Ht 5\' 3"  (1.6 m)   Wt 273 lb (123.8 kg)   LMP 03/31/2022 (Exact Date)   SpO2 95%   BMI 48.36 kg/m?   ? ?Wt Readings from Last 3 Encounters:  ?04/16/22 273 lb (123.8 kg) (>99 %, Z= 2.63)*  ?01/28/22 277 lb (125.6 kg) (>99 %, Z= 2.64)*  ?10/17/21 272 lb 12.8 oz (123.7 kg) (>99 %, Z= 2.60)*  ? ?* Growth percentiles are based on CDC (Girls, 2-20 Years) data.  ? ? ?Physical Exam ?Vitals and nursing note reviewed.  ?Constitutional:   ?   General: She is not in acute distress. ?   Appearance: Normal appearance. She is obese. She is not ill-appearing, toxic-appearing or diaphoretic.  ?HENT:  ?   Head: Normocephalic and atraumatic.  ?Eyes:  ?   Pupils: Pupils are equal, round, and reactive to light.  ?Cardiovascular:  ?   Rate and Rhythm: Normal rate and regular  rhythm.  ?   Heart sounds: Normal heart sounds. No murmur heard. ?  No friction rub. No gallop.  ?Pulmonary:  ?   Effort: Pulmonary effort is normal.  ?   Breath sounds: Normal breath sounds.  ?Skin: ?   General: Skin is warm and dry.  ?   Capillary Refill: Capillary refill takes less than 2 seconds.  ?Neurological:  ?   General: No focal deficit present.  ?   Mental Status: She is alert and oriented to person, place, and time.  ?Psychiatric:     ?   Mood and Affect: Mood normal.     ?   Behavior: Behavior normal.     ?   Thought Content: Thought content normal.     ?   Judgment: Judgment normal.  ? ? ?Results for orders placed or performed in visit on 01/28/22  ?TSH  ?Result Value Ref Range  ? TSH 1.930 0.450 -  4.500 uIU/mL  ? ?   ? ?Pertinent labs & imaging results that were available during my care of the patient were reviewed by me and considered in my medical decision making. ? ?Assessment & Plan:  ?Vaishali was seen today for possible pregnancy. ? ?Diagnoses and all orders for this visit: ? ?Unprotected sex ?Positive pregnancy test ?Pregnancy positive. Referral to OB placed. STI testing pending. Prenatal prescribed.  ?-     Pregnancy, urine ?-     Prenat-Methylfol-Chol-Fish Oil (PRENATAL + COMPLETE MULTI) 0.267 & 373 MG THPK; Take 1 tablet by mouth daily. ?-     Urine cytology ancillary only ?-     Ambulatory referral to Obstetrics / Gynecology ? ?  ? ?Continue all other maintenance medications. ? ?Follow up plan: ?Return if symptoms worsen or fail to improve. ? ? ?Continue healthy lifestyle choices, including diet (rich in fruits, vegetables, and lean proteins, and low in salt and simple carbohydrates) and exercise (at least 30 minutes of moderate physical activity daily). ? ?Educational handout given for safe sex practices ? ?The above assessment and management plan was discussed with the patient. The patient verbalized understanding of and has agreed to the management plan. Patient is aware to call the clinic  if they develop any new symptoms or if symptoms persist or worsen. Patient is aware when to return to the clinic for a follow-up visit. Patient educated on when it is appropriate to go to the emergency department.

## 2022-04-16 NOTE — Patient Instructions (Addendum)
7 weeks 3 days today per LMP ? ?Safe Medications in Pregnancy  ? ?Acne: ?Benzoyl Peroxide ?Salicylic Acid ? ?Backache/Headache: ?Tylenol: 2 regular strength every 4 hours OR ?             2 Extra strength every 6 hours ? ?Colds/Coughs/Allergies: ?Benadryl (alcohol free) 25 mg every 6 hours as needed ?Breath right strips ?Claritin ?Cepacol throat lozenges ?Chloraseptic throat spray ?Cold-Eeze- up to three times per day ?Cough drops, alcohol free ?Flonase (by prescription only) ?Guaifenesin ?Mucinex ?Robitussin DM (plain only, alcohol free) ?Saline nasal spray/drops ?Sudafed (pseudoephedrine) & Actifed ** use only after [redacted] weeks gestation and if you do not have high blood pressure ?Tylenol ?Vicks Vaporub ?Zinc lozenges ?Zyrtec  ? ?Constipation: ?Colace ?Ducolax suppositories ?Fleet enema ?Glycerin suppositories ?Metamucil ?Milk of magnesia ?Miralax ?Senokot ?Smooth move tea ? ?Diarrhea: ?Kaopectate ?Imodium A-D ? ?*NO pepto Bismol ? ?Hemorrhoids: ?Anusol ?Anusol HC ?Preparation H ?Tucks ? ?Indigestion: ?Tums ?Maalox ?Mylanta ?Zantac  ?Pepcid ?Nexium 24HR ? ?Insomnia: ?Benadryl (alcohol free) 25mg  every 6 hours as needed ?Tylenol PM ?Unisom, no Gelcaps ? ?Leg Cramps: ?Tums ?MagGel ? ?Nausea/Vomiting:  ?Bonine ?Dramamine ?Emetrol ?Ginger extract ?Sea bands ?Meclizine  ?Nausea medication to take during pregnancy:  ?Unisom (doxylamine succinate 25 mg tablets) Take one tablet daily at bedtime. If symptoms are not adequately controlled, the dose can be increased to a maximum recommended dose of two tablets daily (1/2 tablet in the morning, 1/2 tablet mid-afternoon and one at bedtime). ?Vitamin B6 100mg  tablets. Take one tablet twice a day (up to 200 mg per day). ? ?Skin Rashes: ?Aveeno products ?Benadryl cream or 25mg  every 6 hours as needed ?Calamine Lotion ?1% cortisone cream ? ?Yeast infection: ?Gyne-lotrimin 7 ?Monistat 7 ? ? ?**If taking multiple medications, please check labels to avoid duplicating the same active  ingredients ?**take medication as directed on the label ?** Do not exceed 4000 mg of tylenol in 24 hours ?**Do not take medications that contain aspirin or ibuprofen ? ? ? ?

## 2022-04-17 LAB — URINE CYTOLOGY ANCILLARY ONLY
Candida Urine: NEGATIVE
Chlamydia: NEGATIVE
Comment: NEGATIVE
Comment: NEGATIVE
Comment: NORMAL
Neisseria Gonorrhea: NEGATIVE
Trichomonas: NEGATIVE

## 2022-04-19 ENCOUNTER — Other Ambulatory Visit: Payer: Self-pay | Admitting: Obstetrics & Gynecology

## 2022-04-19 DIAGNOSIS — O3680X0 Pregnancy with inconclusive fetal viability, not applicable or unspecified: Secondary | ICD-10-CM

## 2022-04-22 ENCOUNTER — Ambulatory Visit (INDEPENDENT_AMBULATORY_CARE_PROVIDER_SITE_OTHER): Payer: Medicaid Other

## 2022-04-22 DIAGNOSIS — Z3A08 8 weeks gestation of pregnancy: Secondary | ICD-10-CM

## 2022-04-22 DIAGNOSIS — O3680X0 Pregnancy with inconclusive fetal viability, not applicable or unspecified: Secondary | ICD-10-CM

## 2022-04-22 NOTE — Progress Notes (Signed)
[place] 6+2 wks,single IUP with yolk sac,FHR 117 bpm,normal ovaries,CRL 5.5 mm ?

## 2022-05-14 ENCOUNTER — Other Ambulatory Visit: Payer: Self-pay | Admitting: Pediatrics

## 2022-05-14 DIAGNOSIS — J309 Allergic rhinitis, unspecified: Secondary | ICD-10-CM

## 2022-05-21 ENCOUNTER — Other Ambulatory Visit: Payer: Self-pay | Admitting: Nurse Practitioner

## 2022-05-21 DIAGNOSIS — J309 Allergic rhinitis, unspecified: Secondary | ICD-10-CM

## 2022-05-30 ENCOUNTER — Other Ambulatory Visit: Payer: Self-pay | Admitting: Obstetrics & Gynecology

## 2022-05-30 DIAGNOSIS — Z3682 Encounter for antenatal screening for nuchal translucency: Secondary | ICD-10-CM

## 2022-05-31 DIAGNOSIS — Z3400 Encounter for supervision of normal first pregnancy, unspecified trimester: Secondary | ICD-10-CM | POA: Insufficient documentation

## 2022-06-03 ENCOUNTER — Ambulatory Visit: Payer: Medicaid Other | Admitting: *Deleted

## 2022-06-03 ENCOUNTER — Ambulatory Visit (INDEPENDENT_AMBULATORY_CARE_PROVIDER_SITE_OTHER): Payer: Medicaid Other | Admitting: Advanced Practice Midwife

## 2022-06-03 ENCOUNTER — Ambulatory Visit (INDEPENDENT_AMBULATORY_CARE_PROVIDER_SITE_OTHER): Payer: Medicaid Other

## 2022-06-03 ENCOUNTER — Encounter: Payer: Self-pay | Admitting: Advanced Practice Midwife

## 2022-06-03 VITALS — BP 134/80 | HR 88 | Wt 275.0 lb

## 2022-06-03 DIAGNOSIS — Z3401 Encounter for supervision of normal first pregnancy, first trimester: Secondary | ICD-10-CM

## 2022-06-03 DIAGNOSIS — Z6841 Body Mass Index (BMI) 40.0 and over, adult: Secondary | ICD-10-CM

## 2022-06-03 DIAGNOSIS — F419 Anxiety disorder, unspecified: Secondary | ICD-10-CM

## 2022-06-03 DIAGNOSIS — Z3A12 12 weeks gestation of pregnancy: Secondary | ICD-10-CM

## 2022-06-03 DIAGNOSIS — Z3682 Encounter for antenatal screening for nuchal translucency: Secondary | ICD-10-CM | POA: Diagnosis not present

## 2022-06-03 DIAGNOSIS — F322 Major depressive disorder, single episode, severe without psychotic features: Secondary | ICD-10-CM

## 2022-06-03 DIAGNOSIS — E039 Hypothyroidism, unspecified: Secondary | ICD-10-CM

## 2022-06-03 LAB — POCT URINALYSIS DIPSTICK OB
Blood, UA: NEGATIVE
Glucose, UA: NEGATIVE
Ketones, UA: NEGATIVE
Leukocytes, UA: NEGATIVE
Nitrite, UA: NEGATIVE
POC,PROTEIN,UA: NEGATIVE

## 2022-06-03 MED ORDER — BLOOD PRESSURE MONITOR MISC: 0 refills | Status: DC

## 2022-06-03 NOTE — Progress Notes (Signed)
[place] 12+2 wks,measurement c/w,CRL 63.60 mm,normal ovaries,NT 1.5 mm,NB present,anterior placenta,FHR 158 bpm

## 2022-06-03 NOTE — Patient Instructions (Signed)
Joanne [name], I greatly value your feedback.  If you receive a survey following your visit with [place] today, we appreciate you taking the time to fill it out.  Thanks, Cathie Beams, DNP, CNM  [facility] HAS MOVED!!! It is now [facility] at [facility] ([address] [place], [place] [address]) Entrance located off of [address] Free 24/7 valet parking   Nausea & Vomiting Have saltine crackers or pretzels by your bed and eat a few bites before you raise your head out of bed in the morning Eat small frequent meals throughout the day instead of large meals Drink plenty of fluids throughout the day to stay hydrated, just don't drink a lot of fluids with your meals.  This can make your stomach fill up faster making you feel sick Do not brush your teeth right after you eat Products with real ginger are good for nausea, like ginger ale and ginger hard candy Make sure it says made with real ginger! Sucking on sour candy like lemon heads is also good for nausea If your prenatal vitamins make you nauseated, take them at night so you will sleep through the nausea Sea Bands If you feel like you need medicine for the nausea & vomiting please let us know If you are unable to keep any fluids or food down please let us know   Constipation Drink plenty of fluid, preferably water, throughout the day Eat foods high in fiber such as fruits, vegetables, and grains Exercise, such as walking, is a good way to keep your bowels regular Drink warm fluids, especially warm prune juice, or decaf coffee Eat a 1/2 cup of real oatmeal (not instant), 1/2 cup applesauce, and 1/2-1 cup warm prune juice every day If needed, you may take Colace (docusate sodium) stool softener once or twice a day to help keep the stool soft.  If you still are having problems with constipation, you may take Miralax once daily as needed to help keep your bowels regular.   Home Blood Pressure Monitoring for  Patients   Your provider has recommended that you check your blood pressure (BP) at least once a week at home. If you do not have a blood pressure cuff at home, one will be provided for you. Contact your provider if you have not received your monitor within 1 week.   Helpful Tips for Accurate Home Blood Pressure Checks  Don't smoke, exercise, or drink caffeine 30 minutes before checking your BP Use the restroom before checking your BP (a full bladder can raise your pressure) Relax in a comfortable upright chair Feet on the ground Left arm resting comfortably on a flat surface at the level of your heart Legs uncrossed Back supported Sit quietly and don't talk Place the cuff on your bare arm Adjust snuggly, so that only two fingertips can fit between your skin and the top of the cuff Check 2 readings separated by at least one minute Keep a log of your BP readings For a visual, please reference this diagram: http://ccnc.care/bpdiagram  Provider Name: [facility]     Phone: [phone]  Zone 1: ALL CLEAR  Continue to monitor your symptoms:  BP reading is less than 140 (top number) or less than 90 (bottom number)  No right upper stomach pain No headaches or seeing spots No feeling nauseated or throwing up No swelling in face and hands  Zone 2: CAUTION Call your doctor's office for any of the following:  BP  reading is greater than 140 (top number) or greater than 90 (bottom number)  Stomach pain under your ribs in the middle or right side Headaches or seeing spots Feeling nauseated or throwing up Swelling in face and hands  Zone 3: EMERGENCY  Seek immediate medical care if you have any of the following:  BP reading is greater than160 (top number) or greater than 110 (bottom number) Severe headaches not improving with Tylenol Serious difficulty catching your breath Any worsening symptoms from Zone 2    First Trimester of Pregnancy The first trimester of pregnancy is from  week 1 until the end of week 12 (months 1 through 3). A week after a sperm fertilizes an egg, the egg will implant on the wall of the uterus. This embryo will begin to develop into a baby. Genes from you and your partner are forming the baby. The female genes determine whether the baby is a boy or a girl. At 6-8 weeks, the eyes and face are formed, and the heartbeat can be seen on ultrasound. At the end of 12 weeks, all the baby's organs are formed.  Now that you are pregnant, you will want to do everything you can to have a healthy baby. Two of the most important things are to get good prenatal care and to follow your health care provider's instructions. Prenatal care is all the medical care you receive before the baby's birth. This care will help prevent, find, and treat any problems during the pregnancy and childbirth. BODY CHANGES Your body goes through many changes during pregnancy. The changes vary from woman to woman.  You may gain or lose a couple of pounds at first. You may feel sick to your stomach (nauseous) and throw up (vomit). If the vomiting is uncontrollable, call your health care provider. You may tire easily. You may develop headaches that can be relieved by medicines approved by your health care provider. You may urinate more often. Painful urination may mean you have a bladder infection. You may develop heartburn as a result of your pregnancy. You may develop constipation because certain hormones are causing the muscles that push waste through your intestines to slow down. You may develop hemorrhoids or swollen, bulging veins (varicose veins). Your breasts may begin to grow larger and become tender. Your nipples may stick out more, and the tissue that surrounds them (areola) may become darker. Your gums may bleed and may be sensitive to brushing and flossing. Dark spots or blotches (chloasma, mask of pregnancy) may develop on your face. This will likely fade after the baby is  born. Your menstrual periods will stop. You may have a loss of appetite. You may develop cravings for certain kinds of food. You may have changes in your emotions from day to day, such as being excited to be pregnant or being concerned that something may go wrong with the pregnancy and baby. You may have more vivid and strange dreams. You may have changes in your hair. These can include thickening of your hair, rapid growth, and changes in texture. Some women also have hair loss during or after pregnancy, or hair that feels dry or thin. Your hair will most likely return to normal after your baby is born. WHAT TO EXPECT AT YOUR PRENATAL VISITS During a routine prenatal visit: You will be weighed to make sure you and the baby are growing normally. Your blood pressure will be taken. Your abdomen will be measured to track your baby's growth. The fetal heartbeat  will be listened to starting around week 10 or 12 of your pregnancy. Test results from any previous visits will be discussed. Your health care provider may ask you: How you are feeling. If you are feeling the baby move. If you have had any abnormal symptoms, such as leaking fluid, bleeding, severe headaches, or abdominal cramping. If you have any questions. Other tests that may be performed during your first trimester include: Blood tests to find your blood type and to check for the presence of any previous infections. They will also be used to check for low iron levels (anemia) and Rh antibodies. Later in the pregnancy, blood tests for diabetes will be done along with other tests if problems develop. Urine tests to check for infections, diabetes, or protein in the urine. An ultrasound to confirm the proper growth and development of the baby. An amniocentesis to check for possible genetic problems. Fetal screens for spina bifida and Down syndrome. You may need other tests to make sure you and the baby are doing well. HOME CARE  INSTRUCTIONS  Medicines Follow your health care provider's instructions regarding medicine use. Specific medicines may be either safe or unsafe to take during pregnancy. Take your prenatal vitamins as directed. If you develop constipation, try taking a stool softener if your health care provider approves. Diet Eat regular, well-balanced meals. Choose a variety of foods, such as meat or vegetable-based protein, fish, milk and low-fat dairy products, vegetables, fruits, and whole grain breads and cereals. Your health care provider will help you determine the amount of weight gain that is right for you. Avoid raw meat and uncooked cheese. These carry germs that can cause birth defects in the baby. Eating four or five small meals rather than three large meals a day may help relieve nausea and vomiting. If you start to feel nauseous, eating a few soda crackers can be helpful. Drinking liquids between meals instead of during meals also seems to help nausea and vomiting. If you develop constipation, eat more high-fiber foods, such as fresh vegetables or fruit and whole grains. Drink enough fluids to keep your urine clear or pale yellow. Activity and Exercise Exercise only as directed by your health care provider. Exercising will help you: Control your weight. Stay in shape. Be prepared for labor and delivery. Experiencing pain or cramping in the lower abdomen or low back is a good sign that you should stop exercising. Check with your health care provider before continuing normal exercises. Try to avoid standing for long periods of time. Move your legs often if you must stand in one place for a long time. Avoid heavy lifting. Wear low-heeled shoes, and practice good posture. You may continue to have sex unless your health care provider directs you otherwise. Relief of Pain or Discomfort Wear a good support bra for breast tenderness.   Take warm sitz baths to soothe any pain or discomfort caused by  hemorrhoids. Use hemorrhoid cream if your health care provider approves.   Rest with your legs elevated if you have leg cramps or low back pain. If you develop varicose veins in your legs, wear support hose. Elevate your feet for 15 minutes, 3-4 times a day. Limit salt in your diet. Prenatal Care Schedule your prenatal visits by the twelfth week of pregnancy. They are usually scheduled monthly at first, then more often in the last 2 months before delivery. Write down your questions. Take them to your prenatal visits. Keep all your prenatal visits as directed by  your health care provider. Safety Wear your seat belt at all times when driving. Make a list of emergency phone numbers, including numbers for family, friends, the hospital, and police and fire departments. General Tips Ask your health care provider for a referral to a local prenatal education class. Begin classes no later than at the beginning of month 6 of your pregnancy. Ask for help if you have counseling or nutritional needs during pregnancy. Your health care provider can offer advice or refer you to specialists for help with various needs. Do not use hot tubs, steam rooms, or saunas. Do not douche or use tampons or scented sanitary pads. Do not cross your legs for long periods of time. Avoid cat litter boxes and soil used by cats. These carry germs that can cause birth defects in the baby and possibly loss of the fetus by miscarriage or stillbirth. Avoid all smoking, herbs, alcohol, and medicines not prescribed by your health care provider. Chemicals in these affect the formation and growth of the baby. Schedule a dentist appointment. At home, brush your teeth with a soft toothbrush and be gentle when you floss. SEEK MEDICAL CARE IF:  You have dizziness. You have mild pelvic cramps, pelvic pressure, or nagging pain in the abdominal area. You have persistent nausea, vomiting, or diarrhea. You have a bad smelling vaginal  discharge. You have pain with urination. You notice increased swelling in your face, hands, legs, or ankles. SEEK IMMEDIATE MEDICAL CARE IF:  You have a fever. You are leaking fluid from your vagina. You have spotting or bleeding from your vagina. You have severe abdominal cramping or pain. You have rapid weight gain or loss. You vomit blood or material that looks like coffee grounds. You are exposed to [place] measles and have never had them. You are exposed to fifth disease or chickenpox. You develop a severe headache. You have shortness of breath. You have any kind of trauma, such as from a fall or a car accident. Document Released: 12/03/2001 Document Revised: 04/25/2014 Document Reviewed: 10/19/2013 [facility] Patient Information 2015 [place], [place]. This information is not intended to replace advice given to you by your health care provider. Make sure you discuss any questions you have with your health care provider.  Coronavirus (COVID-19) Are you at risk?  Are you at risk for the Coronavirus (COVID-19)?  To be considered HIGH RISK for Coronavirus (COVID-19), you have to meet the following criteria:  Traveled to [place], [place], [place], [place] or [place];  and have fever, cough, and shortness of breath within the last 2 weeks of travel OR Been in close contact with a person diagnosed with COVID-19 within the last 2 weeks and have fever, cough, and shortness of breath IF YOU DO NOT MEET THESE CRITERIA, YOU ARE CONSIDERED LOW RISK FOR COVID-19.  What to do if you are HIGH RISK for COVID-19?  If you are having a medical emergency, call 911. Seek medical care right away. Before you go to a doctor's office, urgent care or emergency department, call ahead and tell them about your recent travel, contact with someone diagnosed with COVID-19, and your symptoms. You should receive instructions from your physician's office regarding next steps of care.  When you arrive at healthcare provider,  tell the healthcare staff immediately you have returned from visiting [place], [place], [place], [place] or [place]; in the last two weeks or you have been in close contact with a person diagnosed with COVID-19 in the last 2 weeks.  Tell the health care staff about your symptoms: fever, cough and shortness of breath. After you have been seen by a medical provider, you will be either: Tested for (COVID-19) and discharged home on quarantine except to seek medical care if symptoms worsen, and asked to  Stay home and avoid contact with others until you get your results (4-5 days)  Avoid travel on public transportation if possible (such as bus, train, or airplane) or Sent to the Emergency Department by EMS for evaluation, COVID-19 testing, and possible admission depending on your condition and test results.  What to do if you are LOW RISK for COVID-19?  Reduce your risk of any infection by using the same precautions used for avoiding the common cold or flu:  Wash your hands often with soap and warm water for at least 20 seconds.  If soap and water are not readily available, use an alcohol-based hand sanitizer with at least 60% alcohol.  If coughing or sneezing, cover your mouth and nose by coughing or sneezing into the elbow areas of your shirt or coat, into a tissue or into your sleeve (not your hands). Avoid shaking hands with others and consider head nods or verbal greetings only. Avoid touching your eyes, nose, or mouth with unwashed hands.  Avoid close contact with people who are sick. Avoid places or events with large numbers of people in one location, like concerts or sporting events. Carefully consider travel plans you have or are making. If you are planning any travel outside or inside the [place], visit the CDC's Travelers' Health webpage for the latest health notices. If you have some symptoms but not all symptoms, continue to monitor at home and seek medical attention if your symptoms worsen. If  you are having a medical emergency, call 911.   ADDITIONAL HEALTHCARE OPTIONS FOR [place] / e-Visit: [email or web address]         [facility]: [facility]: [id number]                   [facility]: [phone]     Safe Medications in Pregnancy   Acne: Benzoyl Peroxide Salicylic Acid  Backache/Headache: Tylenol: 2 regular strength every 4 hours OR              2 Extra strength every 6 hours  Colds/Coughs/Allergies: Benadryl (alcohol free) 25 mg every 6 hours as needed Breath right strips Claritin Cepacol throat lozenges Chloraseptic throat spray Cold-Eeze- up to three times per day Cough drops, alcohol free Flonase (by prescription only) Guaifenesin Mucinex Robitussin DM (plain only, alcohol free) Saline nasal spray/drops Sudafed (pseudoephedrine) & Actifed ** use only after [redacted] weeks gestation and if you do not have high blood pressure Tylenol Vicks Vaporub Zinc lozenges Zyrtec   Constipation: Colace Ducolax suppositories Fleet enema Glycerin suppositories Metamucil Milk of magnesia Miralax Senokot Smooth move tea  Diarrhea: Kaopectate Imodium A-D  *NO pepto Bismol  Hemorrhoids: Anusol Anusol HC Preparation H Tucks  Indigestion: Tums Maalox Mylanta Zantac  Pepcid  Insomnia: Benadryl (alcohol free) 36m every 6 hours as needed Tylenol PM Unisom, no Gelcaps  Leg Cramps: Tums MagGel  Nausea/Vomiting:  Bonine Dramamine Emetrol Ginger extract Sea bands Meclizine  Nausea medication to take during pregnancy:  Unisom (doxylamine succinate 25 mg tablets) Take one tablet daily at bedtime. If symptoms are not adequately controlled, the dose can be increased to a maximum recommended dose of two tablets daily (1/2 tablet in  the morning, 1/2 tablet mid-afternoon and one at bedtime). Vitamin B6 160m tablets. Take one  tablet twice a day (up to 200 mg per day).  Skin Rashes: Aveeno products Benadryl cream or 250mevery 6 hours as needed Calamine Lotion 1% cortisone cream  Yeast infection: Gyne-lotrimin 7 Monistat 7   **If taking multiple medications, please check labels to avoid duplicating the same active ingredients **take medication as directed on the label ** Do not exceed 4000 mg of tylenol in 24 hours **Do not take medications that contain aspirin or ibuprofen

## 2022-06-03 NOTE — Progress Notes (Signed)
INITIAL OBSTETRICAL VISIT Patient name: Joanne [name] MRN [id number]  Date of birth: 2001-01-16 Chief Complaint:   Initial Prenatal Visit  History of Present Illness:   Joanne [name] is a 21 y.o. G1P0  female at [redacted]w[redacted]d by [place] at 6 weeks with an Estimated Date of Delivery: 12/14/22 being seen today for her initial obstetrical visit.   Her obstetrical history is significant for 1st baby.  Medical hx: depression/anxiety(on buspar, stopped lexapro when she found out she was pregnant.  Doing ok, sees PCP for meds.  Offered BH referral, accepted) hypothyroid on synthroid. .   Today she reports fatigue.     06/03/2022   10:50 AM 04/16/2022   12:20 PM 01/28/2022    8:46 AM 10/17/2021    4:44 PM 09/10/2021    3:26 PM  Depression screen PHQ 2/9  Decreased Interest 2 2 3 2    Down, Depressed, Hopeless 0 3 3 2    PHQ - 2 Score 2 5 6 4    Altered sleeping 1 3 2 3    Tired, decreased energy 2 3 3 2    Change in appetite 2 3 3 3    Feeling bad or failure about yourself  0 1 2 2    Trouble concentrating 2 3 3 3    Moving slowly or fidgety/restless 1 3 1 1    Suicidal thoughts 0 0 0 0   PHQ-9 Score 10 21 20 18    Difficult doing work/chores  Very difficult Extremely dIfficult Very difficult      Information is confidential and restricted. Go to Review Flowsheets to unlock data.    Patient's last menstrual period was 02/23/2022 (approximate). Last pap n/a. Review of Systems:   Pertinent items are noted in HPI Denies cramping/contractions, leakage of fluid, vaginal bleeding, abnormal vaginal discharge w/ itching/odor/irritation, headaches, visual changes, shortness of breath, chest pain, abdominal pain, severe nausea/vomiting, or problems with urination or bowel movements unless otherwise stated above.  Pertinent History Reviewed:  Reviewed past medical,surgical, social, obstetrical and family history.  Reviewed problem list, medications and allergies. OB History  Gravida Para Term Preterm AB Living  1             SAB IAB Ectopic Multiple Live Births               # Outcome Date GA Lbr Len/2nd Weight Sex Delivery Anes PTL Lv  1 Current            Physical Assessment:   Vitals:   06/03/22 1051  BP: 134/80  Pulse: 88  Weight: 275 lb (124.7 kg)  Body mass index is 48.71 kg/m.       Physical Examination:  General appearance - well appearing, and in no distress  Mental status - alert, oriented to person, place, and time  Psych:  She has a normal mood and affect  Skin - warm and dry, normal color, no suspicious lesions noted  Chest - effort normal  Heart - normal rate and regular rhythm  Abdomen - soft, nontender  Extremities:  No swelling or varicosities noted   TODAY'S NT 12+2 wks,measurement c/w,CRL 63.60 mm,normal ovaries,NT 1.5 mm,NB present,anterior placenta,FHR 158 bpm  Results for orders placed or performed in visit on 06/03/22 (from the past 24 hour(s))  POC Urinalysis Dipstick OB   Collection Time: 06/03/22 11:19 AM  Result Value Ref Range   Color, UA     Clarity, UA     Glucose, UA Negative Negative   Bilirubin, UA  Ketones, UA neg    Spec Grav, UA     Blood, UA neg    pH, UA     POC,PROTEIN,UA Negative Negative, Trace, Small (1+), Moderate (2+), Large (3+), 4+   Urobilinogen, UA     Nitrite, UA neg    Leukocytes, UA Negative Negative   Appearance     Odor      Assessment & Plan:  1) Low-Risk Pregnancy G1P0 at [redacted]w[redacted]d with an Estimated Date of Delivery: 12/14/22   2) Initial OB visit  3) Depression/anxiety:  OK to continue buspar. Referral to [facility] made Meds:  Meds ordered this encounter  Medications   Blood Pressure Monitor MISC    Sig: For regular home bp monitoring during pregnancy    Dispense:  1 each    Refill:  0    Z34.81 Please mail to patient    Initial labs obtained Continue prenatal vitamins Reviewed n/v relief measures and warning s/s to report Reviewed recommended weight gain based on pre-gravid BMI Encouraged well-balanced  diet Genetic & carrier screening discussed: requests Panorama, NT/IT, and Horizon , declines AFP Ultrasound discussed; fetal survey: requested CCNC completed> form faxed if has or is planning to apply for medicaid The nature of [facility] [employer] with multiple MDs and other Advanced Practice Providers was explained to patient; also emphasized that fellows, residents, and students are part of our team. Doesn't have a home bp cuff. Rx faxed. Check bp weekly, let us know if >140/90.        Scarlette Calico Cresenzo-Dishmon 12:24 PM

## 2022-06-04 LAB — TSH: TSH: 1.71 u[IU]/mL (ref 0.450–4.500)

## 2022-06-05 LAB — INTEGRATED 1
Crown Rump Length: 63.6 mm
Gest. Age on Collection Date: 12.6 weeks
Maternal Age at EDD: 19.6 yr
Nuchal Translucency (NT): 1.5 mm
Number of Fetuses: 1
PAPP-A Value: 777 ng/mL
Weight: 275 [lb_av]

## 2022-06-05 LAB — CBC/D/PLT+RPR+RH+ABO+RUBIGG...
Antibody Screen: NEGATIVE
Basophils Absolute: 0 x10E3/uL (ref 0.0–0.2)
Basos: 0 %
EOS (ABSOLUTE): 0 x10E3/uL (ref 0.0–0.4)
Eos: 0 %
HCV Ab: NONREACTIVE
HIV Screen 4th Generation wRfx: NONREACTIVE
Hematocrit: 38.7 % (ref 34.0–46.6)
Hemoglobin: 13.6 g/dL (ref 11.1–15.9)
Hepatitis B Surface Ag: NEGATIVE
Immature Grans (Abs): 0 x10E3/uL (ref 0.0–0.1)
Immature Granulocytes: 0 %
Lymphocytes Absolute: 2.1 x10E3/uL (ref 0.7–3.1)
Lymphs: 26 %
MCH: 32.5 pg (ref 26.6–33.0)
MCHC: 35.1 g/dL (ref 31.5–35.7)
MCV: 93 fL (ref 79–97)
Monocytes Absolute: 0.3 x10E3/uL (ref 0.1–0.9)
Monocytes: 4 %
Neutrophils Absolute: 5.6 x10E3/uL (ref 1.4–7.0)
Neutrophils: 70 %
Platelets: 289 x10E3/uL (ref 150–450)
RBC: 4.18 x10E6/uL (ref 3.77–5.28)
RDW: 13 % (ref 11.7–15.4)
RPR Ser Ql: NONREACTIVE
Rh Factor: NEGATIVE
Rubella Antibodies, IGG: 1.99 {index} (ref >0.99)
WBC: 8 x10E3/uL (ref 3.4–10.8)

## 2022-06-05 LAB — GC/CHLAMYDIA PROBE AMP
Chlamydia trachomatis, NAA: NEGATIVE
Neisseria Gonorrhoeae by PCR: NEGATIVE

## 2022-06-05 LAB — URINE CULTURE

## 2022-06-05 LAB — HCV INTERPRETATION

## 2022-06-05 LAB — HEMOGLOBIN A1C
Est. average glucose Bld gHb Est-mCnc: 94 mg/dL
Hgb A1c MFr Bld: 4.9 % (ref 4.8–5.6)

## 2022-06-07 NOTE — BH Specialist Note (Signed)
Integrated Behavioral Health via Telemedicine Visit  06/20/2022 Joanne [name] [id number]  Number of Integrated Behavioral Health Clinician visits: 1- Initial Visit  Session Start time: 1348   Session End time: 1441  Total time in minutes: 53   Referring Provider: Jacklyn Shell, CNM Patient/Family location: Home [facility] Provider location: [facility]  All persons participating in visit: Patient Joanne [name] and [facility] Brennyn Haisley   Types of Service: Individual psychotherapy and Telephone visit  I connected with Rebacca Baquero and/or Lateesha Neider's  n/a  via  Telephone or Engineer, civil (consulting)  (Video is Surveyor, mining) and verified that I am speaking with the correct person using two identifiers. Discussed confidentiality: Yes   I discussed the limitations of telemedicine and the availability of in person appointments.  Discussed there is a possibility of technology failure and discussed alternative modes of communication if that failure occurs.  I discussed that engaging in this telemedicine visit, they consent to the provision of behavioral healthcare and the services will be billed under their insurance.  Patient and/or legal guardian expressed understanding and consented to Telemedicine visit: Yes   Presenting Concerns: Patient and/or family reports the following symptoms/concerns: Worry that depression will increase in pregnancy; was taking Lexapro and Buspar prior to positive pregnancy. Pt plans to switch jobs soon Designer, industrial/product) due to pregnancy risks; concerned about ongoing low back pain. Pt used to write to cope; open to implementing self-coping strategy; has good social support.  Duration of problem: Current pregnancy; Severity of problem: moderate  Patient and/or Family's Strengths/Protective Factors: Social connections, Concrete supports in place (healthy food, safe environments,  etc.), Sense of purpose, and Physical Health (exercise, healthy diet, medication compliance, etc.)  Goals Addressed: Patient will:  Reduce symptoms of: anxiety, depression, and stress   Increase knowledge and/or ability of: coping skills and stress reduction   Demonstrate ability to: Increase healthy adjustment to current life circumstances and Increase adequate support systems for patient/family  Progress towards Goals: Ongoing  Interventions: Interventions utilized:  Mindfulness or Management consultant, Psychoeducation and/or Health Education, and Link to [employer] Standardized Assessments completed: Not Needed  Patient and/or Family Response: Patient agrees with treatment plan.   Assessment: Patient currently experiencing Major depressive disorder, recurrent, moderate and Psychosocial stress.   Patient may benefit from psychoeducation and brief therapeutic interventions regarding coping with symptoms of depression, anxiety with panic, life stress .  Plan: Follow up with behavioral health clinician on : Two weeks Behavioral recommendations:  -Continue taking prenatal vitamin daily -Accept referral to [facility] -CALM relaxation breathing exercise twice daily (morning; at bedtime with sleep sounds); as needed throughout the day. -Consider obtaining a new journal to use during pregnancy only, to document this new stage of life/life changes Referral(s): Integrated Art gallery manager (In Clinic) and [employer]:  [employer] [facility] [employer]  I discussed the assessment and treatment plan with the patient and/or parent/guardian. They were provided an opportunity to ask questions and all were answered. They agreed with the plan and demonstrated an understanding of the instructions.   They were advised to call back or seek an in-person evaluation if the symptoms worsen or if the condition fails to improve as anticipated.  Valetta Close [name], LCSW     06/03/2022   10:50 AM  04/16/2022   12:20 PM 01/28/2022    8:46 AM 10/17/2021    4:44 PM 09/10/2021    3:26 PM  Depression screen PHQ 2/9  Decreased  Interest 2 2 3 2    Down, Depressed, Hopeless 0 3 3 2    PHQ - 2 Score 2 5 6 4    Altered sleeping 1 3 2 3    Tired, decreased energy 2 3 3 2    Change in appetite 2 3 3 3    Feeling bad or failure about yourself  0 1 2 2    Trouble concentrating 2 3 3 3    Moving slowly or fidgety/restless 1 3 1 1    Suicidal thoughts 0 0 0 0   PHQ-9 Score 10 21 20 18    Difficult doing work/chores  Very difficult Extremely dIfficult Very difficult      Information is confidential and restricted. Go to Review Flowsheets to unlock data.      06/03/2022   10:50 AM 04/16/2022   12:21 PM 01/28/2022    8:46 AM 10/17/2021    4:45 PM  GAD 7 : Generalized Anxiety Score  Nervous, Anxious, on Edge 1 3 3 3   Control/stop worrying 1 3 3 3   Worry too much - different things 1 3 3 3   Trouble relaxing 2 2 3 2   Restless 0 1 1 2   Easily annoyed or irritable 3 3 3 3   Afraid - awful might happen 0 2 2 3   Total GAD 7 Score 8 17 18 19   Anxiety Difficulty  Very difficult Extremely difficult Extremely difficult

## 2022-06-10 ENCOUNTER — Ambulatory Visit: Payer: Medicaid Other | Admitting: Nurse Practitioner

## 2022-06-20 ENCOUNTER — Ambulatory Visit (INDEPENDENT_AMBULATORY_CARE_PROVIDER_SITE_OTHER): Payer: Medicaid Other | Admitting: Clinical

## 2022-06-20 DIAGNOSIS — F331 Major depressive disorder, recurrent, moderate: Secondary | ICD-10-CM

## 2022-06-20 NOTE — BH Specialist Note (Signed)
Pt did not arrive to video visit and did not answer the phone; Left HIPPA-compliant message to call back Lux Skilton from [facility] at  [phone] (Soraya Paquette's office).  ?; left MyChart message for patient.  ? ?

## 2022-06-20 NOTE — Patient Instructions (Addendum)
[facility] [address] [place], [place] [address] [phone] (main office) [phone] [facility] office)  [email or web address]  [email or web address]

## 2022-07-01 ENCOUNTER — Encounter: Payer: Medicaid Other | Admitting: Women's Health

## 2022-07-04 ENCOUNTER — Ambulatory Visit: Payer: Medicaid Other | Admitting: Clinical

## 2022-07-04 DIAGNOSIS — Z91199 Patient's noncompliance with other medical treatment and regimen due to unspecified reason: Secondary | ICD-10-CM

## 2022-07-09 ENCOUNTER — Ambulatory Visit (INDEPENDENT_AMBULATORY_CARE_PROVIDER_SITE_OTHER): Payer: Medicaid Other | Admitting: Nurse Practitioner

## 2022-07-09 ENCOUNTER — Encounter: Payer: Self-pay | Admitting: Nurse Practitioner

## 2022-07-09 DIAGNOSIS — B3731 Acute candidiasis of vulva and vagina: Secondary | ICD-10-CM

## 2022-07-09 NOTE — Progress Notes (Signed)
Virtual Visit  Note Due to COVID-19 pandemic this visit was conducted virtually. This visit type was conducted due to national recommendations for restrictions regarding the COVID-19 Pandemic (e.g. social distancing, sheltering in place) in an effort to limit this patient's exposure and mitigate transmission in our community. All issues noted in this document were discussed and addressed.  A physical exam was not performed with this format.  I connected with Joanne [name] on 07/09/22 at 4:00 pm  by telephone and verified that I am speaking with the correct person using two identifiers. Joanne [name] is currently located at home during visit. The provider, Daryll Drown, NP is located in their office at time of visit.  I discussed the limitations, risks, security and privacy concerns of performing an evaluation and management service by telephone and the availability of in person appointments. I also discussed with the patient that there may be a patient responsible charge related to this service. The patient expressed understanding and agreed to proceed.   History and Present Illness:  Vaginal Discharge The patient's primary symptoms include vaginal discharge. The patient's pertinent negatives include no genital rash or vaginal bleeding. This is a new problem. The current episode started in the past 7 days. The problem has been unchanged. The patient is experiencing no pain. Pertinent negatives include no abdominal pain, back pain, chills, constipation, dysuria, fever, flank pain, rash or vomiting. Nothing aggravates the symptoms. She has tried nothing for the symptoms. She is sexually active. It is unknown whether or not her partner has an STD. Contraceptive use: Patient is [redacted] weeks pregnant.      Review of Systems  Constitutional:  Negative for chills, fever and malaise/fatigue.  Gastrointestinal:  Negative for abdominal pain, constipation and vomiting.  Genitourinary:  Positive for vaginal  discharge. Negative for dysuria and flank pain.  Musculoskeletal:  Negative for back pain.  Skin: Negative.  Negative for itching and rash.  All other systems reviewed and are negative.    Observations/Objective: Tele visit patient not in distress.  Assessment and Plan: Patient is reporting vaginal discharge and itching.  She is [redacted] weeks pregnant.  I provided education to patient that vaginal discharge sometimes can be common during pregnancy but we will be better if patient can come to the lab and do a wet prep and urinalysis.  Patient verbalized understanding.  She is not able to come today but will be able to come on Friday.  Orders have been put in and will treat patient based on lab results lab.   Follow Up Instructions: Follow up with worsening unresolved symptoms    I discussed the assessment and treatment plan with the patient. The patient was provided an opportunity to ask questions and all were answered. The patient agreed with the plan and demonstrated an understanding of the instructions.   The patient was advised to call back or seek an in-person evaluation if the symptoms worsen or if the condition fails to improve as anticipated.  The above assessment and management plan was discussed with the patient. The patient verbalized understanding of and has agreed to the management plan. Patient is aware to call the clinic if symptoms persist or worsen. Patient is aware when to return to the clinic for a follow-up visit. Patient educated on when it is appropriate to go to the emergency department.   Time call ended: 4:12 PM  I provided 12 minutes of  non face-to-face time during this encounter.    Daryll Drown,  NP

## 2022-07-12 ENCOUNTER — Other Ambulatory Visit: Payer: Medicaid Other

## 2022-07-12 ENCOUNTER — Other Ambulatory Visit: Payer: Self-pay | Admitting: Nurse Practitioner

## 2022-07-12 DIAGNOSIS — B3731 Acute candidiasis of vulva and vagina: Secondary | ICD-10-CM

## 2022-07-12 LAB — WET PREP FOR TRICH, YEAST, CLUE
Clue Cell Exam: NEGATIVE
Trichomonas Exam: NEGATIVE
Yeast Exam: POSITIVE — AB

## 2022-07-12 LAB — URINALYSIS, ROUTINE W REFLEX MICROSCOPIC
Bilirubin, UA: NEGATIVE
Glucose, UA: NEGATIVE
Leukocytes,UA: NEGATIVE
Nitrite, UA: NEGATIVE
Protein,UA: NEGATIVE
RBC, UA: NEGATIVE
Specific Gravity, UA: 1.02 (ref 1.005–1.030)
Urobilinogen, Ur: 0.2 mg/dL (ref 0.2–1.0)
pH, UA: 7 (ref 5.0–7.5)

## 2022-07-12 LAB — MICROSCOPIC EXAMINATION: RBC, Urine: NONE SEEN /hpf (ref 0–2)

## 2022-07-12 MED ORDER — CLOTRIMAZOLE 2 % VA CREA: 1.0000 | TOPICAL_CREAM | Freq: Every day | VAGINAL | 0 refills | Status: DC

## 2022-07-13 IMAGING — DX DG CHEST 1V PORT
1 series · 1 of 1 positions shown · non-contrast
Comparison: None.

CLINICAL DATA: Short of breath

EXAM:
PORTABLE CHEST 1 VIEW

[chest ap]
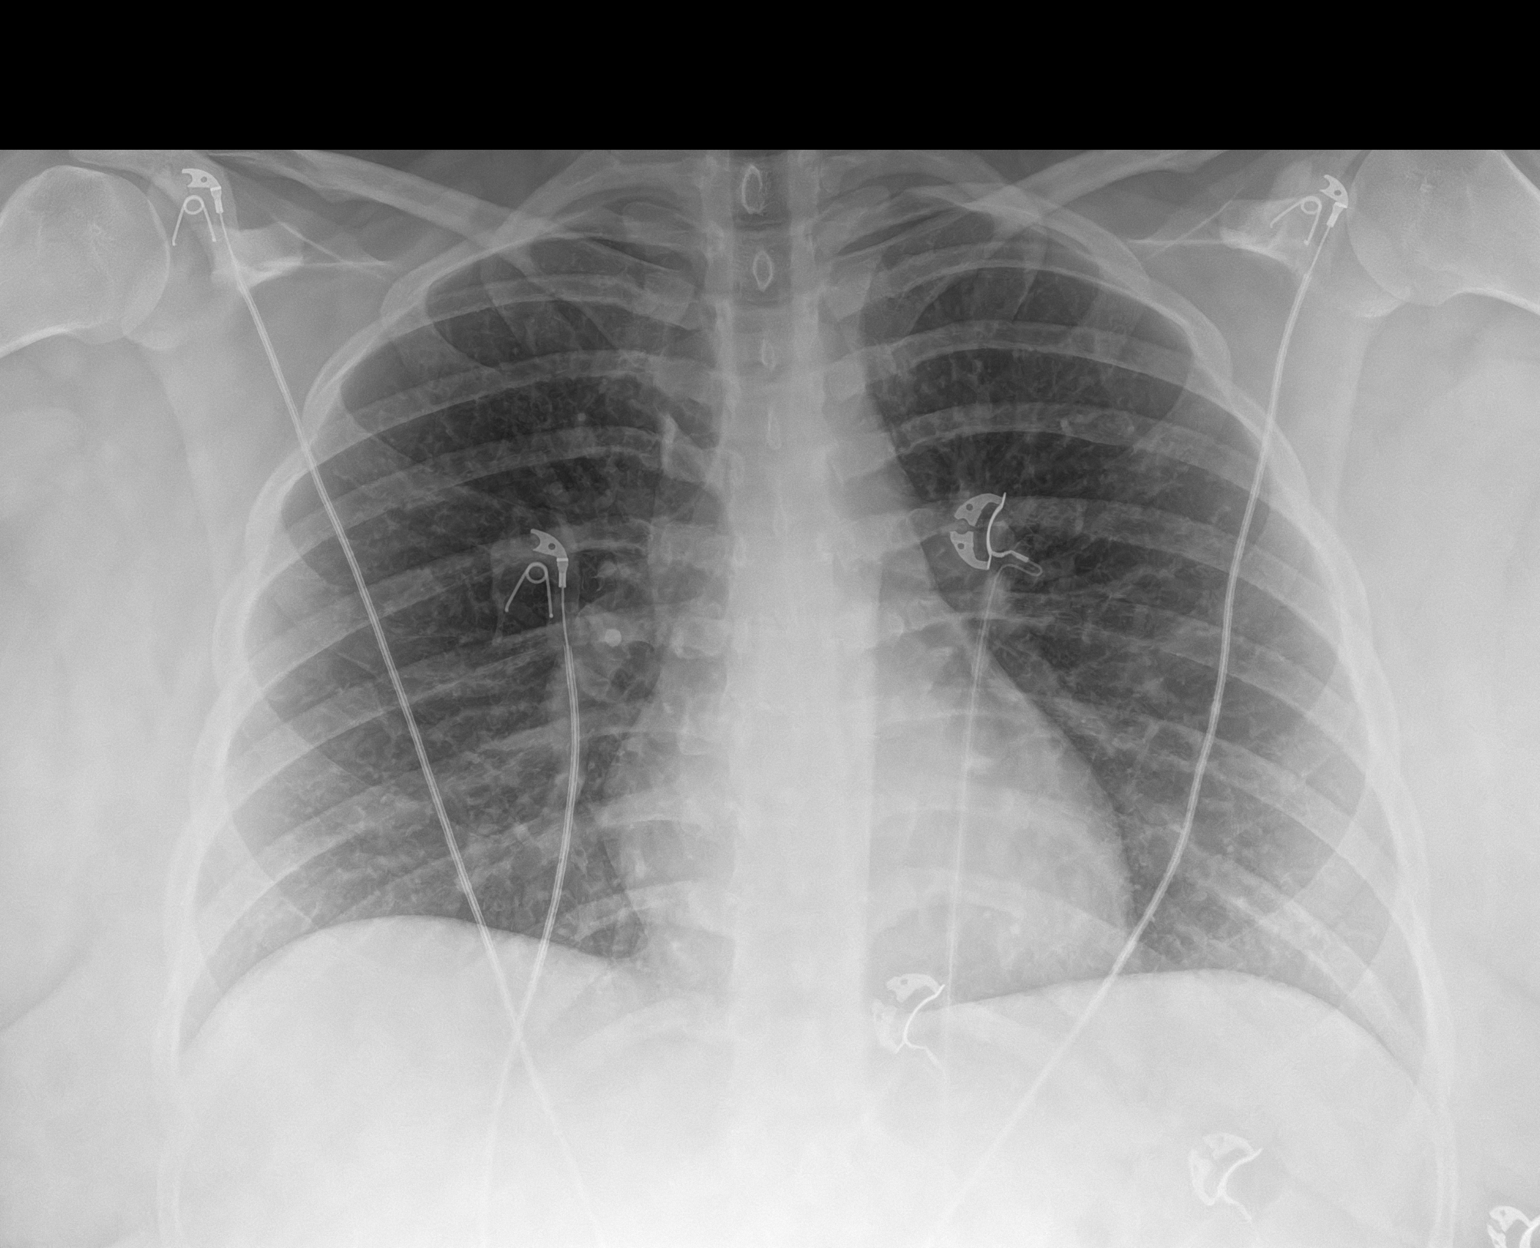

[1 of 1 positions shown; findings below may reference images not displayed]

FINDINGS: The heart size and mediastinal contours are within normal limits.
Both lungs are clear. The visualized skeletal structures are
unremarkable.
IMPRESSION: No active disease.

## 2022-07-14 ENCOUNTER — Other Ambulatory Visit: Payer: Self-pay | Admitting: Nurse Practitioner

## 2022-07-17 LAB — CT, NG, MYCOPLASMAS NAA, URINE
Chlamydia trachomatis, NAA: NEGATIVE
Mycoplasma genitalium NAA: NEGATIVE
Mycoplasma hominis NAA: POSITIVE — AB
Neisseria gonorrhoeae, NAA: NEGATIVE
Ureaplasma spp NAA: POSITIVE — AB

## 2022-07-24 ENCOUNTER — Other Ambulatory Visit: Payer: Self-pay | Admitting: Nurse Practitioner

## 2022-07-24 DIAGNOSIS — R8290 Unspecified abnormal findings in urine: Secondary | ICD-10-CM

## 2022-07-24 MED ORDER — AMOXICILLIN-POT CLAVULANATE 875-125 MG PO TABS: 1.0000 | ORAL_TABLET | Freq: Two times a day (BID) | ORAL | 0 refills | Status: DC

## 2022-07-30 ENCOUNTER — Other Ambulatory Visit: Payer: Self-pay | Admitting: Nurse Practitioner

## 2022-08-01 ENCOUNTER — Encounter: Payer: Medicaid Other | Admitting: Advanced Practice Midwife

## 2022-08-12 ENCOUNTER — Other Ambulatory Visit: Payer: Self-pay | Admitting: Family Medicine

## 2022-08-14 ENCOUNTER — Other Ambulatory Visit: Payer: Self-pay | Admitting: Nurse Practitioner

## 2022-08-15 ENCOUNTER — Encounter: Payer: Medicaid Other | Admitting: Advanced Practice Midwife

## 2022-08-23 ENCOUNTER — Encounter: Payer: Medicaid Other | Admitting: Obstetrics & Gynecology

## 2022-08-26 ENCOUNTER — Other Ambulatory Visit: Payer: Self-pay | Admitting: Nurse Practitioner

## 2022-09-05 ENCOUNTER — Ambulatory Visit (INDEPENDENT_AMBULATORY_CARE_PROVIDER_SITE_OTHER): Payer: Medicaid Other | Admitting: Advanced Practice Midwife

## 2022-09-05 ENCOUNTER — Encounter: Payer: Self-pay | Admitting: Advanced Practice Midwife

## 2022-09-05 VITALS — BP 99/62 | HR 81 | Wt 269.0 lb

## 2022-09-05 DIAGNOSIS — Z3A26 26 weeks gestation of pregnancy: Secondary | ICD-10-CM

## 2022-09-05 DIAGNOSIS — Z3402 Encounter for supervision of normal first pregnancy, second trimester: Secondary | ICD-10-CM

## 2022-09-05 DIAGNOSIS — Z6841 Body Mass Index (BMI) 40.0 and over, adult: Secondary | ICD-10-CM | POA: Insufficient documentation

## 2022-09-05 NOTE — Patient Instructions (Signed)

## 2022-09-05 NOTE — Progress Notes (Addendum)
   WORK-IN LOW-RISK PREGNANCY VISIT Patient name: Joanne [name] MRN [id number]  Date of birth: April 01, 2001 Chief Complaint:   Routine Prenatal Visit (Dog pushed her down yesterday; hasn't had any problems other than not feeling him kick like normal)  History of Present Illness:   Joanne [name] is a 21 y.o. G1P0 female at [redacted]w[redacted]d with an Estimated Date of Delivery: 12/14/22 being seen today for ongoing management of a low-risk pregnancy.  Today she reports knocked down by rottweiler yesterday, fell on side. Contractions: Not present. Vag. Bleeding: None.  Movement: (!) Decreased. denies leaking of fluid. Review of Systems:   Pertinent items are noted in HPI Denies abnormal vaginal discharge w/ itching/odor/irritation, headaches, visual changes, shortness of breath, chest pain, abdominal pain, severe nausea/vomiting, or problems with urination or bowel movements unless otherwise stated above. Pertinent History Reviewed:  Reviewed past medical,surgical, social, obstetrical and family history.  Reviewed problem list, medications and allergies. Physical Assessment:   Vitals:   09/05/22 1454  BP: 99/62  Pulse: 81  Weight: 269 lb (122 kg)  Body mass index is 47.65 kg/m.        Physical Examination:   General appearance: Well appearing, and in no distress  Mental status: Alert, oriented to person, place, and time  Skin: Warm & dry  Cardiovascular: Normal heart rate noted  Respiratory: Normal respiratory effort, no distress  Abdomen: Soft, gravid, nontender  Pelvic: Cervical exam deferred         Extremities: Edema: Trace  Fetal Status: Fetal Heart Rate (bpm): 150   Movement: (!) Decreased   fell yesterday on side, hasn't felt baby move today.  NO bleeding or pain  Chaperone: n/a    No results found for this or any previous visit (from the past 24 hour(s)).  Assessment & Plan:  1) Low-risk pregnancy G1P0 at [redacted]w[redacted]d with an Estimated Date of Delivery: 12/14/22   2) hypothyroid, continue  synthroid  3)  Arrhythmia heard today--refer to MFM   4) obesity BMI>40:  Growth [place] q 4 weeks; testing at 36; IOL 39-40 per MFM Meds: No orders of the defined types were placed in this encounter.  Labs/procedures today: doppler  Plan:  Continue routine obstetrical care Refer to MFM for arrhythmia Next visit: prefers in person    Reviewed: Preterm labor symptoms and general obstetric precautions including but not limited to vaginal bleeding, contractions, leaking of fluid and fetal movement were reviewed in detail with the patient.  All questions were answered. Has home bp cuff.. Check bp weekly, let us know if >140/90.   Follow-up: Return in about 2 weeks (around 09/19/2022) for PN2/LROB; growth [place] q 4 weeks, start w/first available [place] slot.    Orders Placed This Encounter  Procedures   US OB Follow Up   AMB referral to maternal fetal medicine   Jacklyn Shell DNP, CNM 09/26/2022 10:14 AM

## 2022-09-16 ENCOUNTER — Encounter: Payer: Self-pay | Admitting: Obstetrics & Gynecology

## 2022-09-16 ENCOUNTER — Ambulatory Visit (INDEPENDENT_AMBULATORY_CARE_PROVIDER_SITE_OTHER): Payer: Medicaid Other | Admitting: Obstetrics & Gynecology

## 2022-09-16 ENCOUNTER — Telehealth: Payer: Self-pay | Admitting: *Deleted

## 2022-09-16 VITALS — BP 118/70 | HR 84

## 2022-09-16 DIAGNOSIS — N760 Acute vaginitis: Secondary | ICD-10-CM

## 2022-09-16 DIAGNOSIS — B9689 Other specified bacterial agents as the cause of diseases classified elsewhere: Secondary | ICD-10-CM

## 2022-09-16 DIAGNOSIS — Z3402 Encounter for supervision of normal first pregnancy, second trimester: Secondary | ICD-10-CM

## 2022-09-16 DIAGNOSIS — I491 Atrial premature depolarization: Secondary | ICD-10-CM

## 2022-09-16 MED ORDER — NUVESSA 1.3 % VA GEL: 1.0000 | Freq: Once | VAGINAL | 1 refills | Status: AC

## 2022-09-16 NOTE — Progress Notes (Signed)
LOW-RISK PREGNANCY VISIT Patient name: Joanne [name] MRN [id number]  Date of birth: Feb 02, 2001 Chief Complaint:   Routine Prenatal Visit (Work in for spotting and decreased movement)  History of Present Illness:   Joanne [name] is a 21 y.o. G1P0 female at [redacted]w[redacted]d with an Estimated Date of Delivery: 12/14/22 being seen today for ongoing management of a low-risk pregnancy.     06/03/2022   10:50 AM 04/16/2022   12:20 PM 01/28/2022    8:46 AM 10/17/2021    4:44 PM 09/10/2021    3:26 PM  Depression screen PHQ 2/9  Decreased Interest 2 2 3 2    Down, Depressed, Hopeless 0 3 3 2    PHQ - 2 Score 2 5 6 4    Altered sleeping 1 3 2 3    Tired, decreased energy 2 3 3 2    Change in appetite 2 3 3 3    Feeling bad or failure about yourself  0 1 2 2    Trouble concentrating 2 3 3 3    Moving slowly or fidgety/restless 1 3 1 1    Suicidal thoughts 0 0 0 0   PHQ-9 Score 10 21 20 18    Difficult doing work/chores  Very difficult Extremely dIfficult Very difficult      Information is confidential and restricted. Go to Review Flowsheets to unlock data.    Today she reports had some spotting this am Has had discharge and odor for about 1 week consistently  . Contractions: Not present. Vag. Bleeding: Scant.   Movement: (!) Decreased. denies leaking of fluid. Review of Systems:   Pertinent items are noted in HPI Denies abnormal vaginal discharge w/ itching/odor/irritation, headaches, visual changes, shortness of breath, chest pain, abdominal pain, severe nausea/vomiting, or problems with urination or bowel movements unless otherwise stated above. Pertinent History Reviewed:  Reviewed past medical,surgical, social, obstetrical and family history.  Reviewed problem list, medications and allergies. Physical Assessment:   Vitals:   09/16/22 1003  BP: 118/70  Pulse: 84  There is no height or weight on file to calculate BMI.        Physical Examination:   General appearance: Well appearing, and in no  distress  Mental status: Alert, oriented to person, place, and time  Skin: Warm & dry  Cardiovascular: Normal heart rate noted  Respiratory: Normal respiratory effort, no distress  Abdomen: Soft, gravid, nontender  Pelvic: Cervical exam deferred         Extremities: Edema: None  SSE +BV on wet prep Cervix visually closed long Fetal Status:     Movement: (!) Decreased  -->has been moving  Chaperone: Amanda Rash    No results found for this or any previous visit (from the past 24 hour(s)).  Assessment & Plan:  1) Low-risk pregnancy G1P0 at [redacted]w[redacted]d with an Estimated Date of Delivery: 12/14/22     ICD-10-CM   1. BV (bacterial vaginosis)  N76.0    B96.89    e prescribed 1 dose, 1 refill    2. Encounter for supervision of normal first pregnancy in second trimester  Z34.02     3. Premature atrial contractions, benign, ECHO not indicated for this  I49.1         Meds:  Meds ordered this encounter  Medications   NUVESSA 1.3 % GEL    Sig: Place 1 applicator vaginally once for 1 dose.    Dispense:  5 g    Refill:  1   Labs/procedures today:   Plan:  Continue routine obstetrical  care  Next visit: prefers in person      Follow-up: Return for keep scheduled.  No orders of the defined types were placed in this encounter.   Florian Buff, MD 09/16/2022 10:38 AM

## 2022-09-16 NOTE — Telephone Encounter (Signed)
Patient called with c/o vaginal bleeding that started around 6am.  Noticed some clots but bleeding has now stopped.  Denies recent intercourse, heavy lifting and baby is moving. Has been having some mild cramping for a week. Advised patient to come in to office this morning for evaluation. Pt agreeable to plan.

## 2022-09-26 ENCOUNTER — Ambulatory Visit (INDEPENDENT_AMBULATORY_CARE_PROVIDER_SITE_OTHER): Payer: Medicaid Other | Admitting: Advanced Practice Midwife

## 2022-09-26 ENCOUNTER — Encounter: Payer: Self-pay | Admitting: Advanced Practice Midwife

## 2022-09-26 ENCOUNTER — Other Ambulatory Visit: Payer: Medicaid Other

## 2022-09-26 VITALS — BP 120/69 | HR 76 | Wt 266.0 lb

## 2022-09-26 DIAGNOSIS — Z23 Encounter for immunization: Secondary | ICD-10-CM | POA: Diagnosis not present

## 2022-09-26 DIAGNOSIS — Z3A28 28 weeks gestation of pregnancy: Secondary | ICD-10-CM

## 2022-09-26 DIAGNOSIS — Z3403 Encounter for supervision of normal first pregnancy, third trimester: Secondary | ICD-10-CM

## 2022-09-26 DIAGNOSIS — Z131 Encounter for screening for diabetes mellitus: Secondary | ICD-10-CM

## 2022-09-26 DIAGNOSIS — E039 Hypothyroidism, unspecified: Secondary | ICD-10-CM

## 2022-09-26 DIAGNOSIS — O26893 Other specified pregnancy related conditions, third trimester: Secondary | ICD-10-CM

## 2022-09-26 DIAGNOSIS — Z6791 Unspecified blood type, Rh negative: Secondary | ICD-10-CM | POA: Insufficient documentation

## 2022-09-26 NOTE — Progress Notes (Signed)
   LOW-RISK PREGNANCY VISIT Patient name: Joanne [name] MRN [id number]  Date of birth: 2001-03-02 Chief Complaint:   Routine Prenatal Visit (Needs refill on prenatal vitamins)  History of Present Illness:   Joanne [name] is a 21 y.o. G1P0 female at [redacted]w[redacted]d with an Estimated Date of Delivery: 12/14/22 being seen today for ongoing management of a low-risk pregnancy.  Today she reports no complaints. Contractions: Not present. Vag. Bleeding: None.  Movement: Present. denies leaking of fluid. Review of Systems:   Pertinent items are noted in HPI Denies abnormal vaginal discharge w/ itching/odor/irritation, headaches, visual changes, shortness of breath, chest pain, abdominal pain, severe nausea/vomiting, or problems with urination or bowel movements unless otherwise stated above. Pertinent History Reviewed:  Reviewed past medical,surgical, social, obstetrical and family history.  Reviewed problem list, medications and allergies. Physical Assessment:   Vitals:   09/26/22 0944  BP: 120/69  Pulse: 76  Weight: 266 lb (120.7 kg)  Body mass index is 47.12 kg/m.        Physical Examination:   General appearance: Well appearing, and in no distress  Mental status: Alert, oriented to person, place, and time  Skin: Warm & dry  Cardiovascular: Normal heart rate noted  Respiratory: Normal respiratory effort, no distress  Abdomen: Soft, gravid, nontender  Pelvic: Cervical exam deferred         Extremities: Edema: None  Fetal Status:     Movement: Present    Chaperone: n/a    No results found for this or any previous visit (from the past 24 hour(s)).  Assessment & Plan:  1) Low-risk pregnancy G1P0 at [redacted]w[redacted]d with an Estimated Date of Delivery: 12/14/22   2) insufficient PNC, missed 4 appts in July, august and September.   3) PACs heard at previous visit:  Dr. Elonda Husky cancelled MFM echo, FHR no longer w/PACs   Meds: No orders of the defined types were placed in this encounter.  Labs/procedures  today: PN2, TSH, Tdap, flu vaccine  Plan:  Continue routine obstetrical care  Next visit: prefers will be in person for anatomy scan     Reviewed: Preterm labor symptoms and general obstetric precautions including but not limited to vaginal bleeding, contractions, leaking of fluid and fetal movement were reviewed in detail with the patient.  All questions were answered. Has home bp cuff. . Check bp weekly, let us know if >140/90.   Follow-up: No follow-ups on file.  Future Appointments  Date Time Provider [place]  10/03/2022  8:30 AM [facility] [place] [facility] None  10/03/2022  9:50 AM Christin Fudge, CNM [facility]  10/31/2022  9:15 AM [facility] [place] [facility] None  11/28/2022  8:30 AM [facility] [place] [facility] None    Orders Placed This Encounter  Procedures  . Tdap vaccine greater than or equal to 7yo IM  . TSH   Christin Fudge DNP, CNM 09/26/2022 10:05 AM

## 2022-09-27 LAB — CBC
Hematocrit: 37.5 % (ref 34.0–46.6)
Hemoglobin: 12.9 g/dL (ref 11.1–15.9)
MCH: 32.4 pg (ref 26.6–33.0)
MCHC: 34.4 g/dL (ref 31.5–35.7)
MCV: 94 fL (ref 79–97)
Platelets: 239 10*3/uL (ref 150–450)
RBC: 3.98 x10E6/uL (ref 3.77–5.28)
RDW: 12 % (ref 11.7–15.4)
WBC: 8.5 10*3/uL (ref 3.4–10.8)

## 2022-09-27 LAB — TSH: TSH: 1.55 u[IU]/mL (ref 0.450–4.500)

## 2022-09-27 LAB — GLUCOSE TOLERANCE, 2 HOURS W/ 1HR
Glucose, 1 hour: 115 mg/dL (ref 70–179)
Glucose, 2 hour: 101 mg/dL (ref 70–152)
Glucose, Fasting: 67 mg/dL — ABNORMAL LOW (ref 70–91)

## 2022-09-27 LAB — HIV ANTIBODY (ROUTINE TESTING W REFLEX): HIV Screen 4th Generation wRfx: NONREACTIVE

## 2022-09-27 LAB — ANTIBODY SCREEN: Antibody Screen: NEGATIVE

## 2022-09-27 LAB — RPR: RPR Ser Ql: NONREACTIVE

## 2022-10-02 ENCOUNTER — Other Ambulatory Visit: Payer: Self-pay | Admitting: Advanced Practice Midwife

## 2022-10-02 DIAGNOSIS — Z6841 Body Mass Index (BMI) 40.0 and over, adult: Secondary | ICD-10-CM

## 2022-10-02 DIAGNOSIS — Z3402 Encounter for supervision of normal first pregnancy, second trimester: Secondary | ICD-10-CM

## 2022-10-02 DIAGNOSIS — Z363 Encounter for antenatal screening for malformations: Secondary | ICD-10-CM

## 2022-10-03 ENCOUNTER — Ambulatory Visit (INDEPENDENT_AMBULATORY_CARE_PROVIDER_SITE_OTHER): Payer: Medicaid Other | Admitting: Advanced Practice Midwife

## 2022-10-03 ENCOUNTER — Ambulatory Visit (INDEPENDENT_AMBULATORY_CARE_PROVIDER_SITE_OTHER): Payer: Medicaid Other

## 2022-10-03 DIAGNOSIS — Z3201 Encounter for pregnancy test, result positive: Secondary | ICD-10-CM

## 2022-10-03 DIAGNOSIS — Z3A29 29 weeks gestation of pregnancy: Secondary | ICD-10-CM

## 2022-10-03 DIAGNOSIS — Z363 Encounter for antenatal screening for malformations: Secondary | ICD-10-CM

## 2022-10-03 DIAGNOSIS — Z6791 Unspecified blood type, Rh negative: Secondary | ICD-10-CM

## 2022-10-03 DIAGNOSIS — Z6841 Body Mass Index (BMI) 40.0 and over, adult: Secondary | ICD-10-CM | POA: Diagnosis not present

## 2022-10-03 DIAGNOSIS — Z7251 High risk heterosexual behavior: Secondary | ICD-10-CM | POA: Diagnosis not present

## 2022-10-03 DIAGNOSIS — Z3403 Encounter for supervision of normal first pregnancy, third trimester: Secondary | ICD-10-CM

## 2022-10-03 DIAGNOSIS — Z3402 Encounter for supervision of normal first pregnancy, second trimester: Secondary | ICD-10-CM

## 2022-10-03 MED ORDER — PRENATAL + COMPLETE MULTI 0.267 & 373 MG PO THPK: 1.0000 | PACK | Freq: Every day | ORAL | 3 refills | Status: DC

## 2022-10-03 NOTE — Progress Notes (Signed)
[place] 23+7 wks,cephalic,cx 3.7 cm,anterior placenta gr 0,normal ovaries,AFI 16.7 cm,FHR 138 bpm,EFW 1578 g 64%,anatomy complete,no obvious abnormalities

## 2022-10-03 NOTE — Progress Notes (Signed)
   LOW-RISK PREGNANCY VISIT Patient name: Joanne [name] MRN [id number]  Date of birth: Jan 23, 2001 Chief Complaint:   Routine Prenatal Visit  History of Present Illness:   Joanne [name] is a 21 y.o. G1P0 female at [redacted]w[redacted]d with an Estimated Date of Delivery: 12/14/22 being seen today for ongoing management of a low-risk pregnancy.  Today she reports no complaints. Contractions: Not present. Vag. Bleeding: None.  Movement: Present. denies leaking of fluid. Review of Systems:   Pertinent items are noted in HPI Denies abnormal vaginal discharge w/ itching/odor/irritation, headaches, visual changes, shortness of breath, chest pain, abdominal pain, severe nausea/vomiting, or problems with urination or bowel movements unless otherwise stated above. Pertinent History Reviewed:  Reviewed past medical,surgical, social, obstetrical and family history.  Reviewed problem list, medications and allergies. Physical Assessment:   Vitals:   10/03/22 0913  BP: 116/74  Pulse: 85  Weight: 269 lb (122 kg)  Body mass index is 47.65 kg/m.        Physical Examination:   General appearance: Well appearing, and in no distress  Mental status: Alert, oriented to person, place, and time  Skin: Warm & dry  Cardiovascular: Normal heart rate noted  Respiratory: Normal respiratory effort, no distress  Abdomen: Soft, gravid, nontender  Pelvic: Cervical exam deferred         Extremities: Edema: None  Fetal Status: Fetal Heart Rate (bpm): [place]   Movement: Present  [place] 85+0 wks,cephalic,cx 3.7 cm,anterior placenta gr 0,normal ovaries,AFI 16.7 cm,FHR 138 bpm,EFW 1578 g 64%,anatomy complete,no obvious abnormalities   Chaperone: n/a    No results found for this or any previous visit (from the past 24 hour(s)).  Assessment & Plan:  1) Low-risk pregnancy G1P0 at [redacted]w[redacted]d with an Estimated Date of Delivery: 12/14/22   2) rh neg, rhogam today   Meds:  Meds ordered this encounter  Medications   Prenat-Methylfol-Chol-Fish Oil  (PRENATAL + COMPLETE MULTI) 0.267 & 373 MG THPK    Sig: Take 1 tablet by mouth daily.    Dispense:  90 each    Refill:  3    Order Specific Question:   Supervising Provider    Answer:   Florian Buff [[id number]]   Labs/procedures today: anatomy scan  Plan:  Continue routine obstetrical care  Next visit: prefers will be in person for [place]     Reviewed: Preterm labor symptoms and general obstetric precautions including but not limited to vaginal bleeding, contractions, leaking of fluid and fetal movement were reviewed in detail with the patient.  All questions were answered. Has home bp cuff.Check bp weekly, let us know if >140/90.   Follow-up: Return for add lrob appt to next 2 [place] appts.  Future Appointments  Date Time Provider [place]  10/03/2022  9:50 AM Christin Fudge, CNM [facility]  10/31/2022  9:15 AM [facility] [place] [facility] None  11/28/2022  8:30 AM [facility] [place] [facility] None    Orders Placed This Encounter  Procedures   RHO (D) Immune Globulin   Christin Fudge DNP, CNM 10/03/2022 9:25 AM

## 2022-10-03 NOTE — Patient Instructions (Signed)
Joanne [name], I greatly value your feedback.  If you receive a survey following your visit with [place] today, we appreciate you taking the time to fill it out.  Thanks, [name], DNP, CNM  [place]!!! It is now [place] at [facility] ([place], [place] [address]) Entrance located off of [address] parking   Go to [email or web address] to register for FREE online childbirth classes    Call the office [phone]) or go to [place] if: You begin to have strong, frequent contractions Your water breaks.  Sometimes it is a big gush of fluid, sometimes it is just a trickle that keeps getting your panties wet or running down your legs You have vaginal bleeding.  It is normal to have a small amount of spotting if your cervix was checked.  You don't feel your baby moving like normal.  If you don't, get you something to eat and drink and lay down and focus on feeling your baby move.  You should feel at least 10 movements in 2 hours.  If you don't, you should call the office or go to [place] Blood Pressure Monitoring for Patients   Your provider has recommended that you check your blood pressure (BP) at least once a week at home. If you do not have a blood pressure cuff at home, one will be provided for you. Contact your provider if you have not received your monitor within 1 week.   Helpful Tips for Accurate Home Blood Pressure Checks  Don't smoke, exercise, or drink caffeine 30 minutes before checking your BP Use the restroom before checking your BP (a full bladder can raise your pressure) Relax in a comfortable upright chair Feet on the ground Left arm resting comfortably on a flat surface at the level of your heart Legs uncrossed Back supported Sit quietly and don't talk Place the cuff on your bare arm Adjust snuggly, so that only two fingertips can fit between your skin and the top of  the cuff Check 2 readings separated by at least one minute Keep a log of your BP readings For a visual, please reference this diagram: http://ccnc.care/bpdiagram  Provider Name: [facility]     Phone: [phone]  Zone 1: ALL CLEAR  Continue to monitor your symptoms:  BP reading is less than 140 (top number) or less than 90 (bottom number)  No right upper stomach pain No headaches or seeing spots No feeling nauseated or throwing up No swelling in face and hands  Zone 2: CAUTION Call your doctor's office for any of the following:  BP reading is greater than 140 (top number) or greater than 90 (bottom number)  Stomach pain under your ribs in the middle or right side Headaches or seeing spots Feeling nauseated or throwing up Swelling in face and hands  Zone 3: EMERGENCY  Seek immediate medical care if you have any of the following:  BP reading is greater than160 (top number) or greater than 110 (bottom number) Severe headaches not improving with Tylenol Serious difficulty catching your breath Any worsening symptoms from Zone 2

## 2022-10-30 ENCOUNTER — Other Ambulatory Visit: Payer: Self-pay | Admitting: Nurse Practitioner

## 2022-10-31 ENCOUNTER — Encounter: Payer: Medicaid Other | Admitting: Advanced Practice Midwife

## 2022-10-31 ENCOUNTER — Encounter: Payer: Medicaid Other | Admitting: Obstetrics & Gynecology

## 2022-10-31 ENCOUNTER — Other Ambulatory Visit: Payer: Medicaid Other

## 2022-11-05 ENCOUNTER — Other Ambulatory Visit: Payer: Self-pay | Admitting: Advanced Practice Midwife

## 2022-11-05 DIAGNOSIS — Z363 Encounter for antenatal screening for malformations: Secondary | ICD-10-CM

## 2022-11-05 DIAGNOSIS — Z6841 Body Mass Index (BMI) 40.0 and over, adult: Secondary | ICD-10-CM

## 2022-11-05 DIAGNOSIS — Z3402 Encounter for supervision of normal first pregnancy, second trimester: Secondary | ICD-10-CM

## 2022-11-06 ENCOUNTER — Ambulatory Visit (INDEPENDENT_AMBULATORY_CARE_PROVIDER_SITE_OTHER): Payer: Medicaid Other

## 2022-11-06 ENCOUNTER — Other Ambulatory Visit: Payer: Medicaid Other

## 2022-11-06 ENCOUNTER — Encounter: Payer: Self-pay | Admitting: Obstetrics & Gynecology

## 2022-11-06 ENCOUNTER — Ambulatory Visit (INDEPENDENT_AMBULATORY_CARE_PROVIDER_SITE_OTHER): Payer: Medicaid Other | Admitting: Obstetrics & Gynecology

## 2022-11-06 VITALS — BP 124/71 | HR 85 | Wt 267.8 lb

## 2022-11-06 DIAGNOSIS — Z3A34 34 weeks gestation of pregnancy: Secondary | ICD-10-CM | POA: Diagnosis not present

## 2022-11-06 DIAGNOSIS — Z6841 Body Mass Index (BMI) 40.0 and over, adult: Secondary | ICD-10-CM

## 2022-11-06 DIAGNOSIS — Z6791 Unspecified blood type, Rh negative: Secondary | ICD-10-CM

## 2022-11-06 DIAGNOSIS — Z3403 Encounter for supervision of normal first pregnancy, third trimester: Secondary | ICD-10-CM | POA: Diagnosis not present

## 2022-11-06 DIAGNOSIS — O26899 Other specified pregnancy related conditions, unspecified trimester: Secondary | ICD-10-CM

## 2022-11-06 NOTE — Progress Notes (Signed)
HIGH-RISK PREGNANCY VISIT Patient name: Joanne [name] MRN [id number]  Date of birth: 2001-11-13 Chief Complaint:   Routine Prenatal Visit ([place] today)  History of Present Illness:   Joanne [name] is a 21 y.o. G1P0 female at [redacted]w[redacted]d with an Estimated Date of Delivery: 12/14/22 being seen today for ongoing management of a high-risk pregnancy complicated by   morbid obesity BMI >=40.   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   Contractions: Irritability. Vag. Bleeding: None.  Movement: Present. denies leaking of fluid.      09/26/2022    9:48 AM 06/03/2022   10:50 AM 04/16/2022   12:20 PM 01/28/2022    8:46 AM 10/17/2021    4:44 PM  Depression screen PHQ 2/9  Decreased Interest 3 2 2 3 2   Down, Depressed, Hopeless 2 0 3 3 2   PHQ - 2 Score 5 2 5 6 4   Altered sleeping 3 1 3 2 3   Tired, decreased energy 3 2 3 3 2   Change in appetite 3 2 3 3 3   Feeling bad or failure about yourself  1 0 1 2 2   Trouble concentrating 3 2 3 3 3   Moving slowly or fidgety/restless 2 1 3 1 1   Suicidal thoughts 0 0 0 0 0  PHQ-9 Score 20 10 21 20 18   Difficult doing work/chores   Very difficult Extremely dIfficult Very difficult     Current Outpatient Medications  Medication Instructions   albuterol (VENTOLIN HFA) 108 (90 Base) MCG/ACT inhaler 2 puffs, Inhalation, Every 4 hours PRN   Blood Pressure Monitor MISC For regular home bp monitoring during pregnancy   busPIRone (BUSPAR) 7.5 mg, 2 times daily   fluticasone (FLONASE) 50 MCG/ACT nasal spray 1 spray, Each Nare, Daily   levocetirizine (XYZAL) 5 mg, Oral, Daily   levothyroxine (SYNTHROID) 50 mcg, Oral, Daily   Prenat-Methylfol-Chol-Fish Oil (PRENATAL + COMPLETE MULTI) 0.267 & 373 MG THPK 1 tablet, Oral, Daily     Review of Systems:   Pertinent items are noted in HPI Denies abnormal vaginal discharge w/ itching/odor/irritation, headaches, visual changes, shortness of breath, chest pain, abdominal  pain, severe nausea/vomiting, or problems with urination or bowel movements unless otherwise stated above. Pertinent History Reviewed:  Reviewed past medical,surgical, social, obstetrical and family history.  Reviewed problem list, medications and allergies. Physical Assessment:   Vitals:   11/06/22 1118  BP: 124/71  Pulse: 85  Weight: 267 lb 12.8 oz (121.5 kg)  Body mass index is 47.44 kg/m.           Physical Examination:   General appearance: alert, well appearing, and in no distress  Mental status: normal mood, behavior, speech, dress, motor activity, and thought processes  Skin: warm & dry   Extremities:      Cardiovascular: normal heart rate noted  Respiratory: normal respiratory effort, no distress  Abdomen: gravid, soft, non-tender  Pelvic: Cervical exam deferred         Fetal Status:     Movement: Present    Fetal Surveillance Testing today: cephalic,anterior placenta gr 3,AFI 14.5 cm,FHR 140 bpm,EFW 2524 g 53%    Chaperone: N/A    No results found for this or any previous visit (from the past 24 hour(s)).   Assessment & Plan:  High-risk pregnancy: G1P0 at [redacted]w[redacted]d with an Estimated Date of Delivery: 12/14/22   1) Morbid obesity -growth as above -plan to start BPP @ 36wks per MFM guidelines -briefly discussed  IOL 39-40wk  2) Sleep apnea -pt to establish care with PCP -bring machine to hospital for delivery  3) Hypothyroidism -continue current medication  Meds: No orders of the defined types were placed in this encounter.   Labs/procedures today: growth scan, []  GBS next visit  Treatment Plan:  as outlined above  Reviewed: Preterm labor symptoms and general obstetric precautions including but not limited to vaginal bleeding, contractions, leaking of fluid and fetal movement were reviewed in detail with the patient.  All questions were answered.   Follow-up: Return in about 2 weeks (around 11/20/2022) for [place] visit and BPP starting weekly (@  36wks).   Future Appointments  Date Time Provider Webb  11/06/2022 11:50 AM Janyth Pupa, DO [facility]  11/20/2022  2:50 PM Myrtis Ser, CNM [facility]  12/04/2022 10:45 AM [facility] [place] [facility] None  12/04/2022 11:30 AM Janyth Pupa, DO [facility]    No orders of the defined types were placed in this encounter.   Janyth Pupa, DO Attending [place], [facility] for [employer], [place]

## 2022-11-06 NOTE — Progress Notes (Signed)
[place] 34+4 wks,cephalic,anterior placenta gr 3,AFI 14.5 cm,FHR 140 bpm,EFW 2524 g 53%

## 2022-11-19 ENCOUNTER — Other Ambulatory Visit: Payer: Medicaid Other

## 2022-11-19 ENCOUNTER — Encounter: Payer: Medicaid Other | Admitting: Women's Health

## 2022-11-20 ENCOUNTER — Encounter: Payer: Medicaid Other | Admitting: Advanced Practice Midwife

## 2022-11-20 ENCOUNTER — Other Ambulatory Visit: Payer: Medicaid Other

## 2022-11-26 ENCOUNTER — Other Ambulatory Visit: Payer: Self-pay | Admitting: Obstetrics & Gynecology

## 2022-11-26 DIAGNOSIS — O99213 Obesity complicating pregnancy, third trimester: Secondary | ICD-10-CM

## 2022-11-27 ENCOUNTER — Ambulatory Visit (INDEPENDENT_AMBULATORY_CARE_PROVIDER_SITE_OTHER): Payer: Medicaid Other | Admitting: Obstetrics & Gynecology

## 2022-11-27 ENCOUNTER — Ambulatory Visit (INDEPENDENT_AMBULATORY_CARE_PROVIDER_SITE_OTHER): Payer: Medicaid Other

## 2022-11-27 ENCOUNTER — Other Ambulatory Visit (HOSPITAL_COMMUNITY)
Admission: RE | Admit: 2022-11-27 | Discharge: 2022-11-27 | Disposition: A | Discharge status: Home / Self Care | Payer: Medicaid Other | Source: Ambulatory Visit | Attending: Advanced Practice Midwife | Admitting: Advanced Practice Midwife

## 2022-11-27 ENCOUNTER — Other Ambulatory Visit: Payer: Medicaid Other

## 2022-11-27 ENCOUNTER — Encounter: Payer: Self-pay | Admitting: Obstetrics & Gynecology

## 2022-11-27 VITALS — BP 135/87 | HR 89 | Wt 275.8 lb

## 2022-11-27 DIAGNOSIS — Z3A37 37 weeks gestation of pregnancy: Secondary | ICD-10-CM

## 2022-11-27 DIAGNOSIS — Z6791 Unspecified blood type, Rh negative: Secondary | ICD-10-CM

## 2022-11-27 DIAGNOSIS — Z1332 Encounter for screening for maternal depression: Secondary | ICD-10-CM

## 2022-11-27 DIAGNOSIS — Z3403 Encounter for supervision of normal first pregnancy, third trimester: Secondary | ICD-10-CM | POA: Diagnosis present

## 2022-11-27 DIAGNOSIS — O99213 Obesity complicating pregnancy, third trimester: Secondary | ICD-10-CM

## 2022-11-27 DIAGNOSIS — Z6841 Body Mass Index (BMI) 40.0 and over, adult: Secondary | ICD-10-CM

## 2022-11-27 DIAGNOSIS — O26899 Other specified pregnancy related conditions, unspecified trimester: Secondary | ICD-10-CM

## 2022-11-27 NOTE — Progress Notes (Signed)
[place] 37+4 wks,cephalic,BPP 8/8,anterior placenta gr 3,AFI 18.9 cm,FHR 146 bpm,EFW 3050 g 41%,limited view

## 2022-11-27 NOTE — Progress Notes (Signed)
HIGH-RISK PREGNANCY VISIT Patient name: Joanne [name] MRN [id number]  Date of birth: 24-Jul-2001 Chief Complaint:   Routine Prenatal Visit ([place] today; GBS, GC/CHL)  History of Present Illness:   Joanne [name] is a 21 y.o. 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   Contractions: Irritability. Vag. Bleeding: None.  Movement: Present. denies leaking of fluid.      09/26/2022    9:48 AM 06/03/2022   10:50 AM 04/16/2022   12:20 PM 01/28/2022    8:46 AM 10/17/2021    4:44 PM  Depression screen PHQ 2/9  Decreased Interest 3 2 2 3 2   Down, Depressed, Hopeless 2 0 3 3 2   PHQ - 2 Score 5 2 5 6 4   Altered sleeping 3 1 3 2 3   Tired, decreased energy 3 2 3 3 2   Change in appetite 3 2 3 3 3   Feeling bad or failure about yourself  1 0 1 2 2   Trouble concentrating 3 2 3 3 3   Moving slowly or fidgety/restless 2 1 3 1 1   Suicidal thoughts 0 0 0 0 0  PHQ-9 Score 20 10 21 20 18   Difficult doing work/chores   Very difficult Extremely dIfficult Very difficult     Current Outpatient Medications  Medication Instructions   albuterol (VENTOLIN HFA) 108 (90 Base) MCG/ACT inhaler 2 puffs, Inhalation, Every 4 hours PRN   Blood Pressure Monitor MISC For regular home bp monitoring during pregnancy   busPIRone (BUSPAR) 7.5 mg, 2 times daily   fluticasone (FLONASE) 50 MCG/ACT nasal spray 1 spray, Each Nare, Daily   levocetirizine (XYZAL) 5 mg, Oral, Daily   levothyroxine (SYNTHROID) 50 mcg, Oral, Daily   Prenat-Methylfol-Chol-Fish Oil (PRENATAL + COMPLETE MULTI) 0.267 & 373 MG THPK 1 tablet, Oral, Daily     Review of Systems:   Pertinent items are noted in HPI Denies abnormal vaginal discharge w/ itching/odor/irritation, headaches, visual changes, shortness of breath,  chest pain, abdominal pain, severe nausea/vomiting, or problems with urination or bowel movements unless otherwise stated above. Pertinent History Reviewed:  Reviewed past medical,surgical, social, obstetrical and family history.  Reviewed problem list, medications and allergies. Physical Assessment:   Vitals:   11/27/22 1133 11/27/22 1138  BP: (!) 138/90 135/87  Pulse: 92 89  Weight: 275 lb 12.8 oz (125.1 kg)   Body mass index is 48.86 kg/m.           Physical Examination:   General appearance: alert, well appearing, and in no distress  Mental status: normal mood, behavior, speech, dress, motor activity, and thought processes  Skin: warm & dry   Extremities:      Cardiovascular: normal heart rate noted  Respiratory: normal respiratory effort, no distress  Abdomen: gravid, soft, non-tender  Pelvic: Cervical exam performed  Dilation: Closed Effacement (%): Thick Station: -3  Fetal Status:     Movement: Present    Fetal Surveillance Testing today: cephalic,BPP [id number] placenta gr 3,AFI 18.9 cm,FHR 146 bpm,EFW 3050 g 41%,limited view     Chaperone: Levy Pupa    No results found for this or any previous visit (from the past 24 hour(s)).   Assessment & Plan:  High-risk pregnancy: G1P0 at [redacted]w[redacted]d with an Estimated Date of Delivery: 12/14/22   1) Morbid obesity -growth  appropriate -consider IOL 39-40wks  -Sleep apnea -Hypothyroidism -Dep/Anxiety  Meds: No orders of the defined types were placed in this encounter.   Labs/procedures today: GC/C, GBS  Treatment Plan:  routine OB care  Reviewed: Term labor symptoms and general obstetric precautions including but not limited to vaginal bleeding, contractions, leaking of fluid and fetal movement were reviewed in detail with the patient.  All questions were answered. Pt has home bp cuff. Check bp weekly, let us know if >140/90.   Follow-up: Return for HROB weekly .   Future Appointments  Date Time Provider Department  Center  12/04/2022 10:45 AM [facility] - [facility] [place] [facility] None  12/04/2022 11:30 AM Myna Hidalgo, DO [facility]  12/11/2022 11:30 AM [facility] [place] [facility] None  12/11/2022  1:30 PM Myna Hidalgo, DO [facility]    Orders Placed This Encounter  Procedures   Strep Gp B NAA    Myna Hidalgo, DO Attending Obstetrician & Gynecologist, [facility] for [employer], [facility]

## 2022-11-28 ENCOUNTER — Other Ambulatory Visit: Payer: Medicaid Other

## 2022-11-28 ENCOUNTER — Encounter: Payer: Medicaid Other | Admitting: Advanced Practice Midwife

## 2022-11-28 LAB — CERVICOVAGINAL ANCILLARY ONLY
Chlamydia: NEGATIVE
Comment: NEGATIVE
Comment: NORMAL
Neisseria Gonorrhea: NEGATIVE

## 2022-11-29 LAB — STREP GP B NAA: Strep Gp B NAA: NEGATIVE

## 2022-12-03 ENCOUNTER — Other Ambulatory Visit: Payer: Self-pay | Admitting: Obstetrics & Gynecology

## 2022-12-04 ENCOUNTER — Ambulatory Visit (INDEPENDENT_AMBULATORY_CARE_PROVIDER_SITE_OTHER): Payer: Medicaid Other | Admitting: Obstetrics & Gynecology

## 2022-12-04 ENCOUNTER — Other Ambulatory Visit: Payer: Self-pay | Admitting: Advanced Practice Midwife

## 2022-12-04 ENCOUNTER — Encounter (HOSPITAL_COMMUNITY): Payer: Self-pay | Admitting: Obstetrics and Gynecology

## 2022-12-04 ENCOUNTER — Other Ambulatory Visit: Payer: Self-pay | Admitting: Obstetrics and Gynecology

## 2022-12-04 ENCOUNTER — Ambulatory Visit (INDEPENDENT_AMBULATORY_CARE_PROVIDER_SITE_OTHER): Payer: Medicaid Other

## 2022-12-04 ENCOUNTER — Inpatient Hospital Stay (HOSPITAL_COMMUNITY)
Admission: AD | Admit: 2022-12-04 | Discharge: 2022-12-04 | Disposition: A | Discharge status: Home / Self Care | Payer: Medicaid Other | Attending: Obstetrics and Gynecology | Admitting: Obstetrics and Gynecology

## 2022-12-04 VITALS — BP 147/90 | HR 103 | Wt 280.2 lb

## 2022-12-04 DIAGNOSIS — R519 Headache, unspecified: Secondary | ICD-10-CM | POA: Diagnosis not present

## 2022-12-04 DIAGNOSIS — Z3A38 38 weeks gestation of pregnancy: Secondary | ICD-10-CM

## 2022-12-04 DIAGNOSIS — O133 Gestational [pregnancy-induced] hypertension without significant proteinuria, third trimester: Secondary | ICD-10-CM

## 2022-12-04 DIAGNOSIS — O26893 Other specified pregnancy related conditions, third trimester: Secondary | ICD-10-CM | POA: Insufficient documentation

## 2022-12-04 DIAGNOSIS — Z3403 Encounter for supervision of normal first pregnancy, third trimester: Secondary | ICD-10-CM

## 2022-12-04 DIAGNOSIS — Z6791 Unspecified blood type, Rh negative: Secondary | ICD-10-CM

## 2022-12-04 LAB — COMPREHENSIVE METABOLIC PANEL
ALT: 15 U/L (ref 0–44)
AST: 20 U/L (ref 15–41)
Albumin: 2.6 g/dL — ABNORMAL LOW (ref 3.5–5.0)
Alkaline Phosphatase: 94 U/L (ref 38–126)
Anion gap: 8 (ref 5–15)
BUN: 10 mg/dL (ref 6–20)
CO2: 19 mmol/L — ABNORMAL LOW (ref 22–32)
Calcium: 8.8 mg/dL — ABNORMAL LOW (ref 8.9–10.3)
Chloride: 111 mmol/L (ref 98–111)
Creatinine, Ser: 0.57 mg/dL (ref 0.44–1.00)
GFR, Estimated: >60 mL/min (ref >60)
Glucose, Bld: 79 mg/dL (ref 70–99)
Potassium: 4.1 mmol/L (ref 3.5–5.1)
Sodium: 138 mmol/L (ref 135–145)
Total Bilirubin: 0.2 mg/dL — ABNORMAL LOW (ref 0.3–1.2)
Total Protein: 6 g/dL — ABNORMAL LOW (ref 6.5–8.1)

## 2022-12-04 LAB — PROTEIN / CREATININE RATIO, URINE
Creatinine, Urine: 104 mg/dL
Protein Creatinine Ratio: 0.08 mg/mg{Cre} (ref 0.00–0.15)
Total Protein, Urine: 8 mg/dL

## 2022-12-04 LAB — CBC WITH DIFFERENTIAL/PLATELET
Abs Immature Granulocytes: 0.02 10*3/uL (ref 0.00–0.07)
Basophils Absolute: 0 10*3/uL (ref 0.0–0.1)
Basophils Relative: 0 %
Eosinophils Absolute: 0.1 10*3/uL (ref 0.0–0.5)
Eosinophils Relative: 1 %
HCT: 34.4 % — ABNORMAL LOW (ref 36.0–46.0)
Hemoglobin: 12.4 g/dL (ref 12.0–15.0)
Immature Granulocytes: 0 %
Lymphocytes Relative: 21 %
Lymphs Abs: 2 10*3/uL (ref 0.7–4.0)
MCH: 32.6 pg (ref 26.0–34.0)
MCHC: 36 g/dL (ref 30.0–36.0)
MCV: 90.5 fL (ref 80.0–100.0)
Monocytes Absolute: 0.5 10*3/uL (ref 0.1–1.0)
Monocytes Relative: 5 %
Neutro Abs: 6.8 10*3/uL (ref 1.7–7.7)
Neutrophils Relative %: 73 %
Platelets: 238 10*3/uL (ref 150–400)
RBC: 3.8 MIL/uL — ABNORMAL LOW (ref 3.87–5.11)
RDW: 12.5 % (ref 11.5–15.5)
WBC: 9.3 10*3/uL (ref 4.0–10.5)
nRBC: 0 % (ref 0.0–0.2)

## 2022-12-04 LAB — POCT URINALYSIS DIPSTICK OB
Blood, UA: NEGATIVE
Glucose, UA: NEGATIVE
Ketones, UA: NEGATIVE
Nitrite, UA: NEGATIVE
POC,PROTEIN,UA: NEGATIVE

## 2022-12-04 NOTE — MAU Note (Signed)
.  Joanne [name] is a 21 y.o. at [redacted]w[redacted]d here in MAU reporting: HTN in the office, sent over for BP eval. Also reports a headache today, has not taken any medication for it. Denies VB or LOF. +FM.    Pain score: 6

## 2022-12-04 NOTE — MAU Provider Note (Signed)
History     CSN: [id number]  Arrival date and time: 12/04/22 1453   Event Date/Time   First Provider Initiated Contact with Patient 12/04/22 1548      Chief Complaint  Patient presents with   Headache   Hypertension   HPI  Ms.Kasie Nassar is a 21 y.o. female G1P0 @ [redacted]w[redacted]d here with elevated BP's in the office today. She reports 2 elevated readings while there today (149/94 & 147/90) She was sent here for further workup and evaluation. She has no HA now. She reports occasional HA over the last few days. She reports one episode of blurred vision, however none further.  She reports + fetal movement.   OB History     Gravida  1   Para      Term      Preterm      AB      Living         SAB      IAB      Ectopic      Multiple      Live Births              Past Medical History:  Diagnosis Date   Allergy    Anxiety    Asthma    Depression    Hypothyroidism    Sleep apnea    Thyroid disease     Past Surgical History:  Procedure Laterality Date   BREAST REDUCTION SURGERY Bilateral 08/07/2020   Procedure: BILATERAL MAMMARY REDUCTION  (BREAST);  Surgeon: [name], MD;  Location: [facility];  Service: Plastics;  Laterality: Bilateral;  2.5 hours, please    Family History  Problem Relation Age of Onset   Stroke Mother    Heart disease Mother    Asthma Mother    Arthritis Mother    Anxiety disorder Mother    Alcohol abuse Mother    Drug abuse Mother    Hypertension Father    Alcohol abuse Father    Drug abuse Father    Bipolar disorder Sister    Drug abuse Sister    Depression Sister    Asthma Sister    Anxiety disorder Sister    Alcohol abuse Sister    ADD / ADHD Sister    Drug abuse Brother    Depression Brother    Alcohol abuse Brother    Drug abuse Maternal Grandmother    Cancer Maternal Grandmother        lung   Drug abuse Maternal Grandfather    Cancer Maternal Grandfather        oral/throat   Alcohol abuse  Maternal Grandfather    Drug abuse Paternal Grandmother    Hyperlipidemia Paternal Grandfather    Diabetes Paternal Grandfather     Social History   Tobacco Use   Smoking status: Former    Packs/day: 1.00    Years: 4.00    Total pack years: 4.00    Types: Cigarettes    Quit date: 2019    Years since quitting: 4.9   Smokeless tobacco: Never  Vaping Use   Vaping Use: Former   Quit date: 10/11/2021   Substances: Nicotine, Flavoring  Substance Use Topics   Alcohol use: Not Currently   Drug use: Not Currently    Types: Marijuana    Comment: once or twice per week - 2 blunts per week    Allergies:  Allergies  Allergen Reactions   Red Dye Rash  Medications Prior to Admission  Medication Sig Dispense Refill Last Dose   levothyroxine (SYNTHROID) 50 MCG tablet TAKE 1 TABLET BY MOUTH DAILY 90 tablet 1 12/04/2022   Prenat-Methylfol-Chol-Fish Oil (PRENATAL + COMPLETE MULTI) 0.267 & 373 MG THPK Take 1 tablet by mouth daily. 90 each 3 12/04/2022   albuterol (VENTOLIN HFA) 108 (90 Base) MCG/ACT inhaler Inhale 2 puffs into the lungs every 4 (four) hours as needed for wheezing or shortness of breath. 8 g 0    Blood Pressure Monitor MISC For regular home bp monitoring during pregnancy 1 each 0    busPIRone (BUSPAR) 7.5 MG tablet Take 7.5 mg by mouth 2 (two) times daily. (Patient not taking: Reported on 09/05/2022)      fluticasone (FLONASE) 50 MCG/ACT nasal spray Place 1 spray into both nostrils daily. 16 g 5    levocetirizine (XYZAL) 5 MG tablet Take 5 mg by mouth daily.      Results for orders placed or performed during the hospital encounter of 12/04/22 (from the past 48 hour(s))  Protein / creatinine ratio, urine     Status: None   Collection Time: 12/04/22  3:49 PM  Result Value Ref Range   Creatinine, Urine 104 mg/dL   Total Protein, Urine 8 mg/dL    Comment: NO NORMAL RANGE ESTABLISHED FOR THIS TEST   Protein Creatinine Ratio 0.08 0.00 - 0.15 mg/mg[Cre]    Comment: Performed  at [facility] [address] [place], [place] [address]  CBC with Differential/Platelet     Status: Abnormal   Collection Time: 12/04/22  3:55 PM  Result Value Ref Range   WBC 9.3 4.0 - 10.5 K/uL   RBC 3.80 (L) 3.87 - 5.11 MIL/uL   Hemoglobin 12.4 12.0 - 15.0 g/dL   HCT 34.4 (L) 36.0 - 46.0 %   MCV 90.5 80.0 - 100.0 fL   MCH 32.6 26.0 - 34.0 pg   MCHC 36.0 30.0 - 36.0 g/dL   RDW 12.5 11.5 - 15.5 %   Platelets 238 150 - 400 K/uL   nRBC 0.0 0.0 - 0.2 %   Neutrophils Relative % 73 %   Neutro Abs 6.8 1.7 - 7.7 K/uL   Lymphocytes Relative 21 %   Lymphs Abs 2.0 0.7 - 4.0 K/uL   Monocytes Relative 5 %   Monocytes Absolute 0.5 0.1 - 1.0 K/uL   Eosinophils Relative 1 %   Eosinophils Absolute 0.1 0.0 - 0.5 K/uL   Basophils Relative 0 %   Basophils Absolute 0.0 0.0 - 0.1 K/uL   Immature Granulocytes 0 %   Abs Immature Granulocytes 0.02 0.00 - 0.07 K/uL    Comment: Performed at [facility] [address] [place], [place] [address]  Comprehensive metabolic panel     Status: Abnormal   Collection Time: 12/04/22  3:55 PM  Result Value Ref Range   Sodium 138 135 - 145 mmol/L   Potassium 4.1 3.5 - 5.1 mmol/L   Chloride 111 98 - 111 mmol/L   CO2 19 (L) 22 - 32 mmol/L   Glucose, Bld 79 70 - 99 mg/dL    Comment: Glucose reference range applies only to samples taken after fasting for at least 8 hours.   BUN 10 6 - 20 mg/dL   Creatinine, Ser 0.57 0.44 - 1.00 mg/dL   Calcium 8.8 (L) 8.9 - 10.3 mg/dL   Total Protein 6.0 (L) 6.5 - 8.1 g/dL   Albumin 2.6 (L) 3.5 - 5.0 g/dL  AST 20 15 - 41 U/L   ALT 15 0 - 44 U/L   Alkaline Phosphatase 94 38 - 126 U/L   Total Bilirubin 0.2 (L) 0.3 - 1.2 mg/dL   GFR, Estimated >16 >10 mL/min    Comment: (NOTE) Calculated using the CKD-EPI Creatinine Equation (2021)    Anion gap 8 5 - 15    Comment: Performed at [facility], [address] [place], [place] [address]    Review of Systems  Constitutional:  Negative for fever.  Eyes:   Negative for photophobia and visual disturbance.  Neurological:  Negative for dizziness and headaches.   Physical Exam   Blood pressure 125/74, pulse 94, temperature 97.9 F (36.6 C), temperature source Oral, resp. rate 14, weight 128.2 kg, last menstrual period 02/23/2022, SpO2 98 %.  Patient Vitals for the past 24 hrs:  BP Temp Temp src Pulse Resp SpO2 Weight  12/04/22 1812 111/79 -- -- 94 -- -- --  12/04/22 1801 (!) 117/39 -- -- 96 -- -- --  12/04/22 1746 (!) 127/51 -- -- (!) 108 -- -- --  12/04/22 1731 129/70 -- -- (!) 105 -- -- --  12/04/22 1716 (!) 124/50 -- -- 100 -- -- --  12/04/22 1701 125/74 -- -- 94 -- -- --  12/04/22 1646 125/61 -- -- (!) 105 -- -- --  12/04/22 1631 (!) 112/59 -- -- (!) 112 -- -- --  12/04/22 1616 110/67 -- -- (!) 101 -- -- --  12/04/22 1601 125/70 -- -- 91 -- -- --  12/04/22 1546 133/67 -- -- 97 -- -- --  12/04/22 1531 (!) 123/59 -- -- (!) 104 -- -- --  12/04/22 1517 129/73 97.9 F (36.6 C) Oral (!) 109 14 98 % --  12/04/22 1503 -- -- -- -- -- -- 128.2 kg     Physical Exam Constitutional:      General: She is not in acute distress.    Appearance: She is well-developed. She is not ill-appearing, toxic-appearing or diaphoretic.  Pulmonary:     Effort: Pulmonary effort is normal.  Musculoskeletal:        General: Normal range of motion.  Skin:    General: Skin is warm.  Neurological:     Mental Status: She is alert and oriented to person, place, and time.     Deep Tendon Reflexes: Reflexes normal (Negative clonus).  Psychiatric:        Mood and Affect: Mood normal.    Fetal Tracing Fetus  Baseline: 130 bpm Variability: Moderate  Accelerations: 15x15 Decelerations: None Toco: Occasional.   MAU Course  Procedures  MDM  CBC, CMP, PCR. Reviewed patient with Dr. Para March.  Spoke with labor about induction this week. Scheduled for 12/17 BP's normal in MAU.   Assessment and Plan   A:  1. Gestational hypertension, third trimester    2. [redacted] weeks gestation of pregnancy     P:  Dc home Return to MAU if symptoms worsen Pre E precautions reviewed Induction scheduled for Saturday, return sooner if symptoms arise.   Duane Lope, NP 12/04/2022 7:21 PM

## 2022-12-04 NOTE — Progress Notes (Signed)
[place] 38+4 wks,cephalic,anterior placenta gr 3,BPP 8/8,FHR 154 bpm,AFI 17 cm

## 2022-12-04 NOTE — Progress Notes (Signed)
HIGH-RISK PREGNANCY VISIT Patient name: Joanne [name] MRN [id number]  Date of birth: 27-Sep-2001 Chief Complaint:   Routine Prenatal Visit  History of Present Illness:   Joanne [name] is a 21 y.o. G1P0 female at [redacted]w[redacted]d with an Estimated Date of Delivery: 12/14/22 being seen today for ongoing management of a high-risk pregnancy complicated by:  -obesity -Sleep apena -Hypothyroidism -Rh neg.    Today she reports no complaints.   Contractions: Irritability. Vag. Bleeding: None.  Movement: Present. denies leaking of fluid.   No headache currently, but did have one earlier this week.  No visual changes.  Some swelling.     11/27/2022   11:51 AM 09/26/2022    9:48 AM 06/03/2022   10:50 AM 04/16/2022   12:20 PM 01/28/2022    8:46 AM  Depression screen PHQ 2/9  Decreased Interest 2 3 2 2 3   Down, Depressed, Hopeless 1 2 0 3 3  PHQ - 2 Score 3 5 2 5 6   Altered sleeping 2 3 1 3 2   Tired, decreased energy 1 3 2 3 3   Change in appetite 2 3 2 3 3   Feeling bad or failure about yourself  0 1 0 1 2  Trouble concentrating 3 3 2 3 3   Moving slowly or fidgety/restless 1 2 1 3 1   Suicidal thoughts 0 0 0 0 0  PHQ-9 Score 12 20 10 21 20   Difficult doing work/chores    Very difficult Extremely dIfficult     Current Outpatient Medications  Medication Instructions   albuterol (VENTOLIN HFA) 108 (90 Base) MCG/ACT inhaler 2 puffs, Inhalation, Every 4 hours PRN   Blood Pressure Monitor MISC For regular home bp monitoring during pregnancy   busPIRone (BUSPAR) 7.5 mg, 2 times daily   fluticasone (FLONASE) 50 MCG/ACT nasal spray 1 spray, Each Nare, Daily   levocetirizine (XYZAL) 5 mg, Oral, Daily   levothyroxine (SYNTHROID) 50 mcg, Oral, Daily   Prenat-Methylfol-Chol-Fish Oil (PRENATAL + COMPLETE MULTI) 0.267 & 373 MG THPK 1 tablet, Oral, Daily     Review of Systems:   Pertinent items are noted in HPI Denies abnormal vaginal discharge w/ itching/odor/irritation, headaches, visual changes, shortness  of breath, chest pain, abdominal pain, severe nausea/vomiting, or problems with urination or bowel movements unless otherwise stated above. Pertinent History Reviewed:  Reviewed past medical,surgical, social, obstetrical and family history.  Reviewed problem list, medications and allergies. Physical Assessment:   Vitals:   12/04/22 1124 12/04/22 1131  BP: (!) 149/94 (!) 147/90  Pulse: 96 (!) 103  Weight: 280 lb 3.2 oz (127.1 kg)   Body mass index is 49.64 kg/m.           Physical Examination:   General appearance: alert, well appearing, and in no distress  Mental status: normal mood, behavior, speech, dress, motor activity, and thought processes  Skin: warm & dry   Extremities:      Cardiovascular: normal heart rate noted  Respiratory: normal respiratory effort, no distress  Abdomen: gravid, soft, non-tender  Pelvic: Cervical exam performed  Dilation: Fingertip Effacement (%): Thick Station: -3  Fetal Status:     Movement: Present    Fetal Surveillance Testing today: BPP-  cephalic,anterior placenta gr 3,BPP 8/8,FHR 154 bpm,AFI 17 cm   Chaperone:  pt declined     Results for orders placed or performed in visit on 12/04/22 (from the past 24 hour(s))  POC Urinalysis Dipstick OB   Collection Time: 12/04/22 11:28 AM  Result Value Ref Range  Color, UA     Clarity, UA     Glucose, UA Negative Negative   Bilirubin, UA     Ketones, UA neg    Spec Grav, UA     Blood, UA neg    pH, UA     POC,PROTEIN,UA Negative Negative, Trace, Small (1+), Moderate (2+), Large (3+), 4+   Urobilinogen, UA     Nitrite, UA neg    Leukocytes, UA Trace (A) Negative   Appearance     Odor       Assessment & Plan:  High-risk pregnancy: G1P0 at [redacted]w[redacted]d with an Estimated Date of Delivery: 12/14/22   -obesity -Sleep apena -Hypothyroidism -Rh neg.    -BP elevated today- concern for gestational HTN Pt sent to MAU for further evaluation and possible IOL  Meds: No orders of the defined types were  placed in this encounter.   Labs/procedures today: BPP  Treatment Plan:  MAU for further evaluation and possible IOL  Follow-up: Return for TBD.   Future Appointments  Date Time Provider Department Center  12/11/2022 11:30 AM [facility] [place] [facility] None  12/11/2022  1:30 PM Myna Hidalgo, DO [facility]    Orders Placed This Encounter  Procedures   POC Urinalysis Dipstick OB    Myna Hidalgo, DO Attending Obstetrician & Gynecologist, [facility] for [employer], [facility]

## 2022-12-05 ENCOUNTER — Telehealth (HOSPITAL_COMMUNITY): Payer: Self-pay | Admitting: *Deleted

## 2022-12-05 ENCOUNTER — Encounter: Payer: Self-pay | Admitting: Advanced Practice Midwife

## 2022-12-05 ENCOUNTER — Encounter (HOSPITAL_COMMUNITY): Payer: Self-pay

## 2022-12-05 NOTE — Telephone Encounter (Signed)
Preadmission screen  

## 2022-12-06 ENCOUNTER — Encounter (HOSPITAL_COMMUNITY): Payer: Self-pay | Admitting: *Deleted

## 2022-12-06 ENCOUNTER — Telehealth (HOSPITAL_COMMUNITY): Payer: Self-pay | Admitting: *Deleted

## 2022-12-06 NOTE — Telephone Encounter (Signed)
Preadmission screen  

## 2022-12-08 ENCOUNTER — Other Ambulatory Visit: Payer: Self-pay

## 2022-12-08 ENCOUNTER — Inpatient Hospital Stay (HOSPITAL_COMMUNITY)
Admission: RE | Admit: 2022-12-08 | Discharge: 2022-12-13 | DRG: 806 | Disposition: A | Discharge status: Home / Self Care | Payer: Medicaid Other | Attending: Obstetrics and Gynecology | Admitting: Obstetrics and Gynecology

## 2022-12-08 ENCOUNTER — Encounter (HOSPITAL_COMMUNITY): Payer: Self-pay | Admitting: Obstetrics & Gynecology

## 2022-12-08 ENCOUNTER — Inpatient Hospital Stay (HOSPITAL_COMMUNITY): Payer: Medicaid Other

## 2022-12-08 DIAGNOSIS — O24424 Gestational diabetes mellitus in childbirth, insulin controlled: Secondary | ICD-10-CM | POA: Diagnosis not present

## 2022-12-08 DIAGNOSIS — E039 Hypothyroidism, unspecified: Secondary | ICD-10-CM | POA: Diagnosis present

## 2022-12-08 DIAGNOSIS — F324 Major depressive disorder, single episode, in partial remission: Secondary | ICD-10-CM | POA: Diagnosis present

## 2022-12-08 DIAGNOSIS — O864 Pyrexia of unknown origin following delivery: Secondary | ICD-10-CM | POA: Diagnosis not present

## 2022-12-08 DIAGNOSIS — O99214 Obesity complicating childbirth: Secondary | ICD-10-CM | POA: Diagnosis present

## 2022-12-08 DIAGNOSIS — Z87891 Personal history of nicotine dependence: Secondary | ICD-10-CM

## 2022-12-08 DIAGNOSIS — Z6791 Unspecified blood type, Rh negative: Secondary | ICD-10-CM | POA: Diagnosis not present

## 2022-12-08 DIAGNOSIS — J45909 Unspecified asthma, uncomplicated: Secondary | ICD-10-CM | POA: Diagnosis present

## 2022-12-08 DIAGNOSIS — O133 Gestational [pregnancy-induced] hypertension without significant proteinuria, third trimester: Secondary | ICD-10-CM

## 2022-12-08 DIAGNOSIS — O26899 Other specified pregnancy related conditions, unspecified trimester: Secondary | ICD-10-CM

## 2022-12-08 DIAGNOSIS — Z8759 Personal history of other complications of pregnancy, childbirth and the puerperium: Secondary | ICD-10-CM

## 2022-12-08 DIAGNOSIS — O24425 Gestational diabetes mellitus in childbirth, controlled by oral hypoglycemic drugs: Principal | ICD-10-CM | POA: Diagnosis present

## 2022-12-08 DIAGNOSIS — O9952 Diseases of the respiratory system complicating childbirth: Secondary | ICD-10-CM | POA: Diagnosis present

## 2022-12-08 DIAGNOSIS — O134 Gestational [pregnancy-induced] hypertension without significant proteinuria, complicating childbirth: Secondary | ICD-10-CM | POA: Diagnosis present

## 2022-12-08 DIAGNOSIS — O99284 Endocrine, nutritional and metabolic diseases complicating childbirth: Secondary | ICD-10-CM | POA: Diagnosis present

## 2022-12-08 DIAGNOSIS — O26893 Other specified pregnancy related conditions, third trimester: Secondary | ICD-10-CM | POA: Diagnosis present

## 2022-12-08 DIAGNOSIS — Z3A39 39 weeks gestation of pregnancy: Secondary | ICD-10-CM | POA: Diagnosis not present

## 2022-12-08 DIAGNOSIS — O139 Gestational [pregnancy-induced] hypertension without significant proteinuria, unspecified trimester: Secondary | ICD-10-CM | POA: Diagnosis present

## 2022-12-08 DIAGNOSIS — Z3400 Encounter for supervision of normal first pregnancy, unspecified trimester: Secondary | ICD-10-CM

## 2022-12-08 DIAGNOSIS — O99344 Other mental disorders complicating childbirth: Secondary | ICD-10-CM | POA: Diagnosis present

## 2022-12-08 LAB — COMPREHENSIVE METABOLIC PANEL
ALT: 13 U/L (ref 0–44)
AST: 17 U/L (ref 15–41)
Albumin: 2.6 g/dL — ABNORMAL LOW (ref 3.5–5.0)
Alkaline Phosphatase: 92 U/L (ref 38–126)
Anion gap: 8 (ref 5–15)
BUN: 9 mg/dL (ref 6–20)
CO2: 19 mmol/L — ABNORMAL LOW (ref 22–32)
Calcium: 8.7 mg/dL — ABNORMAL LOW (ref 8.9–10.3)
Chloride: 110 mmol/L (ref 98–111)
Creatinine, Ser: 0.48 mg/dL (ref 0.44–1.00)
GFR, Estimated: >60 mL/min (ref >60)
Glucose, Bld: 66 mg/dL — ABNORMAL LOW (ref 70–99)
Potassium: 3.9 mmol/L (ref 3.5–5.1)
Sodium: 137 mmol/L (ref 135–145)
Total Bilirubin: 0.4 mg/dL (ref 0.3–1.2)
Total Protein: 5.9 g/dL — ABNORMAL LOW (ref 6.5–8.1)

## 2022-12-08 LAB — CBC
HCT: 35.2 % — ABNORMAL LOW (ref 36.0–46.0)
Hemoglobin: 12.8 g/dL (ref 12.0–15.0)
MCH: 33.3 pg (ref 26.0–34.0)
MCHC: 36.4 g/dL — ABNORMAL HIGH (ref 30.0–36.0)
MCV: 91.7 fL (ref 80.0–100.0)
Platelets: 209 10*3/uL (ref 150–400)
RBC: 3.84 MIL/uL — ABNORMAL LOW (ref 3.87–5.11)
RDW: 12.5 % (ref 11.5–15.5)
WBC: 10 10*3/uL (ref 4.0–10.5)
nRBC: 0 % (ref 0.0–0.2)

## 2022-12-08 LAB — TYPE AND SCREEN
ABO/RH(D): O NEG
Antibody Screen: NEGATIVE

## 2022-12-08 LAB — PROTEIN / CREATININE RATIO, URINE
Creatinine, Urine: 146 mg/dL
Protein Creatinine Ratio: 0.08 mg/mg{Cre} (ref 0.00–0.15)
Total Protein, Urine: 11 mg/dL

## 2022-12-08 MED ORDER — ACETAMINOPHEN 325 MG PO TABS
650.0000 mg | ORAL_TABLET | ORAL | Status: DC | PRN
  Filled 2022-12-08: qty 2

## 2022-12-08 MED ORDER — OXYTOCIN BOLUS FROM INFUSION
333.0000 mL | Freq: Once | INTRAVENOUS | Status: AC
  Administered 2022-12-11: 333 mL via INTRAVENOUS

## 2022-12-08 MED ORDER — LIDOCAINE HCL (PF) 1 % IJ SOLN
30.0000 mL | INTRAMUSCULAR | Status: AC | PRN
  Administered 2022-12-11: 30 mL via SUBCUTANEOUS
  Filled 2022-12-08: qty 30

## 2022-12-08 MED ORDER — SOD CITRATE-CITRIC ACID 500-334 MG/5ML PO SOLN: 30.0000 mL | ORAL | Status: DC | PRN

## 2022-12-08 MED ORDER — MISOPROSTOL 50MCG HALF TABLET
50.0000 ug | ORAL_TABLET | Freq: Once | ORAL | Status: AC
  Administered 2022-12-08: 50 ug via ORAL
  Filled 2022-12-08: qty 1

## 2022-12-08 MED ORDER — LACTATED RINGERS IV SOLN: INTRAVENOUS | Status: DC

## 2022-12-08 MED ORDER — MISOPROSTOL 25 MCG QUARTER TABLET: 25.0000 ug | ORAL_TABLET | ORAL | Status: DC

## 2022-12-08 MED ORDER — MISOPROSTOL 25 MCG QUARTER TABLET
25.0000 ug | ORAL_TABLET | ORAL | Status: DC
  Administered 2022-12-08: 25 ug via VAGINAL
  Filled 2022-12-08: qty 1

## 2022-12-08 MED ORDER — TERBUTALINE SULFATE 1 MG/ML IJ SOLN: 0.2500 mg | Freq: Once | INTRAMUSCULAR | Status: DC | PRN

## 2022-12-08 MED ORDER — ONDANSETRON HCL 4 MG/2ML IJ SOLN
4.0000 mg | Freq: Four times a day (QID) | INTRAMUSCULAR | Status: DC | PRN
  Administered 2022-12-10: 4 mg via INTRAVENOUS
  Filled 2022-12-08: qty 2

## 2022-12-08 MED ORDER — OXYCODONE-ACETAMINOPHEN 5-325 MG PO TABS: 1.0000 | ORAL_TABLET | ORAL | Status: DC | PRN

## 2022-12-08 MED ORDER — OXYCODONE-ACETAMINOPHEN 5-325 MG PO TABS: 2.0000 | ORAL_TABLET | ORAL | Status: DC | PRN

## 2022-12-08 MED ORDER — LEVOTHYROXINE SODIUM 50 MCG PO TABS
50.0000 ug | ORAL_TABLET | Freq: Every day | ORAL | Status: DC
  Administered 2022-12-09 – 2022-12-13 (×5): 50 ug via ORAL
  Filled 2022-12-08 (×5): qty 1

## 2022-12-08 MED ORDER — MISOPROSTOL 50MCG HALF TABLET
50.0000 ug | ORAL_TABLET | Freq: Once | ORAL | Status: AC
  Administered 2022-12-09: 50 ug via ORAL
  Filled 2022-12-08: qty 1

## 2022-12-08 MED ORDER — LACTATED RINGERS IV SOLN
500.0000 mL | INTRAVENOUS | Status: DC | PRN
  Administered 2022-12-08 – 2022-12-10 (×2): 500 mL via INTRAVENOUS
  Administered 2022-12-10 – 2022-12-11 (×2): 1000 mL via INTRAVENOUS

## 2022-12-08 NOTE — Progress Notes (Signed)
Patient ID: Joanne [name], female   DOB: [birth year], 21 y.o.   MRN: [id number]  Labor Progress Note [name] is a 21 y.o. G1P0 at [redacted]w[redacted]d presented for IOL for gHTN   S:  Pt sitting in bed with friend at bedside for support. Feeling some cramping, but tolerating well.  O:  BP (!) 108/57 (BP Location: Right Arm)   Pulse 98   Temp 97.7 F (36.5 C) (Axillary)   Resp 17   Ht 5\' 3"  (1.6 m)   Wt 272 lb (123.4 kg)   LMP 02/23/2022 (Approximate)   BMI 48.18 kg/m  EFM: baseline 140 bpm/ moderate variability/ 15x15 accels/ no decels  Toco/IUPC: q2-78min SVE: Dilation: 1.5 Effacement (%): Thick Cervical Position: Posterior Station: -3 Presentation: Vertex Exam by:: [email or web address], RN Pitocin: none   A/P: 22 y.o. G1P0 [redacted]w[redacted]d  1. Labor: Latent, will give second dose of cytotec after next check in [redacted]w[redacted]d 2. FWB: Cat 1 3. Pain: Planning epidural, coping well now 4. gHTN: BP stable, no s/sx PEC  Continue IOL process, will assess for FB at next exam. Anticipate SVD.  , CNM, MSN, IBCLC Certified Nurse Midwife, [facility]

## 2022-12-08 NOTE — H&P (Addendum)
OBSTETRIC ADMISSION HISTORY AND PHYSICAL  Joanne [name] is a 21 y.o. female G1P0 at [redacted]w[redacted]d by LMP presenting for IOL for A2GDM (metformin). She reports +FMs, No LOF, no VB, no blurry vision, headaches or peripheral edema, and RUQ pain.  She plans on bottle feeding. She request Nexplanon for birth control. She received her prenatal care at [facility]   Dating: By LMP --->  @EDD @ [place]:  11/27/22 @[redacted]w[redacted]d , CWD, normal anatomy, cephalic presentation, [id number], 41% EFW  Prenatal History/Complications: [id number], hypothyroid (synthroid), anx/dep, his cocaine/MJ use (last two UDS negative), sleep apnea (on CPAP)   FAMILY TREE  RESULTS  Language english Pap <21  Initiated care at 12wks GC/CT Initial:   -/-         36wks:  Dating by 6wk [place]    Support person  Genetics NT/IT  Didn't complete.too late  Panorama: low risk female  BP cuff rs'd Carrier Screen     [place]/Hgb Elec   Rhogam     TDaP vaccine 10/5 Blood Type O/Negative/-- (06/12 1203)  Flu vaccine 10/5 Antibody Negative (06/12 1203)  Covid vaccine  HBsAg Negative (06/12 1203)    RPR Non Reactive (06/12 1203)  Anatomy US Normal female Rubella  1.99 (06/12 1203)  Feeding Plan bottle HIV Non Reactive (06/12 1203)  Contraception Nexplanon Hep C   Circumcision yes    Pediatrician Discussed  A1C/GTT Early:      26-28wks:normla  Prenatal Classes       GBS    neg [ ]  PCN allergy  BTL Consent n/a    VBAC Consent n/a PHQ9 & GAD7  [ ] New OB  [ ] 28wks   [ ] 36wks  Waterbirth [ ] Class [ ]  36wkCNM visit/consent     OB History     Gravida  1   Para      Term      Preterm      AB      Living         SAB      IAB      Ectopic      Multiple      Live Births             Past Medical History:  Diagnosis Date   Allergy    Anxiety    Asthma    Depression    Hypothyroidism    Pregnancy induced hypertension    Sleep apnea    Thyroid disease    Past Surgical History:  Procedure Laterality Date   BREAST REDUCTION SURGERY Bilateral  08/07/2020   Procedure: BILATERAL MAMMARY REDUCTION  (BREAST);  Surgeon: Cindra Presume, MD;  Location: [place];  Service: Plastics;  Laterality: Bilateral;  2.5 hours, please   Family History: family history includes ADD / ADHD in her sister; Alcohol abuse in her brother, father, maternal grandfather, mother, and sister; Anxiety disorder in her mother and sister; Arthritis in her mother; Asthma in her mother and sister; Bipolar disorder in her sister; Cancer in her maternal grandfather and maternal grandmother; Depression in her brother and sister; Diabetes in her paternal grandfather; Drug abuse in her brother, father, maternal grandfather, maternal grandmother, mother, paternal grandmother, and sister; Heart disease in her mother; Hyperlipidemia in her paternal grandfather; Hypertension in her father; Stroke in her mother. Social History:  reports that she quit smoking about 4 years ago. Her smoking use included cigarettes. She has a 4.00 pack-year smoking history. She has never used smokeless tobacco. She reports that  she does not currently use alcohol. She reports that she does not currently use drugs after having used the following drugs: Marijuana.    Maternal Diabetes: No Genetic Screening: Normal Maternal Ultrasounds/Referrals: Normal Fetal Ultrasounds or other Referrals:  None Maternal Substance Abuse:  No Significant Maternal Medications:  Meds include: Syntroid Other:  Significant Maternal Lab Results:  Group B Strep negative and Rh negative Number of Prenatal Visits:greater than 3 verified prenatal visits Other Comments:   Buspar  Pertinent items noted in HPI and remainder of comprehensive ROS otherwise negative.   Exam Physical Exam Vitals reviewed.  Constitutional:      General: She is not in acute distress.    Appearance: She is obese. She is not ill-appearing.  HENT:     Head: Normocephalic and atraumatic.  Eyes:     Pupils: Pupils are equal, round, and  reactive to light.  Cardiovascular:     Rate and Rhythm: Normal rate and regular rhythm.  Pulmonary:     Effort: Pulmonary effort is normal.  Abdominal:     Palpations: Abdomen is soft.  Musculoskeletal:        General: Normal range of motion.  Skin:    General: Skin is warm and dry.     Capillary Refill: Capillary refill takes less than 2 seconds.  Neurological:     Mental Status: She is alert and oriented to person, place, and time.  Psychiatric:        Mood and Affect: Mood normal.        Behavior: Behavior normal.        Thought Content: Thought content normal.        Judgment: Judgment normal.     Fetal Tracing: reactive Baseline: 135 Variability: moderate Accelerations: 15x15 Decelerations: none Toco: UI  Dilation: 1 Effacement (%): Thick Station: -3 Exam by:: Hipolito Bayley, RN Blood pressure 132/76, pulse 97, temperature 98.1 F (36.7 C), temperature source Oral, resp. rate 17, height 5\' 3"  (1.6 m), weight 123.4 kg, last menstrual period 02/23/2022.  Prenatal labs: ABO, Rh: --/--/O NEG (12/17 1327) Antibody: NEG (12/17 1327) Rubella: 1.99 (06/12 1203) RPR: Non Reactive (10/05 0824)  HBsAg: Negative (06/12 1203)  HIV: Non Reactive (10/05 0824)  GBS: Negative/-- (12/06 1500)   Assessment: 1. Labor: IOL 2. Fetal Wellbeing: Category I  3. Pain Control: Comfort measures 4. GBS: Neg 5. 39.1 week IUP 6. GHNT 7. Hypothyroid 8. Depression  Plan:  1. Admit to BS per consult with MD 2. Routine L&D orders 3. Analgesia/anesthesia PRN  4. Cytotec. Will discuss Foley ripening. Pit and AROM PRN. 5. Pre-E labs 6. Synthroid 07-[birth year]-1986 daily at 0600 7. Restart Buspar PP 8. CPAP while asleep ordered  12/08/2022, 3:36 PM

## 2022-12-09 ENCOUNTER — Inpatient Hospital Stay (HOSPITAL_COMMUNITY): Payer: Medicaid Other | Admitting: Anesthesiology

## 2022-12-09 LAB — CBC
HCT: 36.5 % (ref 36.0–46.0)
HCT: 38.5 % (ref 36.0–46.0)
Hemoglobin: 13.4 g/dL (ref 12.0–15.0)
Hemoglobin: 13.9 g/dL (ref 12.0–15.0)
MCH: 33.3 pg (ref 26.0–34.0)
MCH: 33.3 pg (ref 26.0–34.0)
MCHC: 36.7 g/dL — ABNORMAL HIGH (ref 30.0–36.0)
MCHC: 36.1 g/dL — ABNORMAL HIGH (ref 30.0–36.0)
MCV: 90.8 fL (ref 80.0–100.0)
MCV: 92.1 fL (ref 80.0–100.0)
Platelets: 236 10*3/uL (ref 150–400)
Platelets: 275 10*3/uL (ref 150–400)
RBC: 4.02 MIL/uL (ref 3.87–5.11)
RBC: 4.18 MIL/uL (ref 3.87–5.11)
RDW: 12.3 % (ref 11.5–15.5)
RDW: 12.4 % (ref 11.5–15.5)
WBC: 18.6 10*3/uL — ABNORMAL HIGH (ref 4.0–10.5)
WBC: 16.8 10*3/uL — ABNORMAL HIGH (ref 4.0–10.5)
nRBC: 0 % (ref 0.0–0.2)
nRBC: 0 % (ref 0.0–0.2)

## 2022-12-09 LAB — RPR: RPR Ser Ql: NONREACTIVE

## 2022-12-09 MED ORDER — LIDOCAINE HCL (PF) 1 % IJ SOLN
INTRAMUSCULAR | Status: DC | PRN
  Administered 2022-12-09 (×2): 4 mL via EPIDURAL

## 2022-12-09 MED ORDER — FENTANYL CITRATE (PF) 100 MCG/2ML IJ SOLN
INTRAMUSCULAR | Status: AC
  Administered 2022-12-09: 100 ug via INTRAVENOUS
  Filled 2022-12-09: qty 2

## 2022-12-09 MED ORDER — DIPHENHYDRAMINE HCL 50 MG/ML IJ SOLN: 12.5000 mg | INTRAMUSCULAR | Status: DC | PRN

## 2022-12-09 MED ORDER — FENTANYL CITRATE (PF) 100 MCG/2ML IJ SOLN
100.0000 ug | INTRAMUSCULAR | Status: DC | PRN
  Administered 2022-12-09 – 2022-12-11 (×7): 100 ug via INTRAVENOUS
  Filled 2022-12-09 (×7): qty 2

## 2022-12-09 MED ORDER — FENTANYL CITRATE (PF) 100 MCG/2ML IJ SOLN
100.0000 ug | Freq: Once | INTRAMUSCULAR | Status: AC
  Administered 2022-12-09: 100 ug via INTRAVENOUS

## 2022-12-09 MED ORDER — FENTANYL-BUPIVACAINE-NACL 0.5-0.125-0.9 MG/250ML-% EP SOLN
12.0000 mL/h | EPIDURAL | Status: DC | PRN
  Administered 2022-12-09 – 2022-12-10 (×2): 12 mL/h via EPIDURAL
  Filled 2022-12-09 (×2): qty 250

## 2022-12-09 MED ORDER — TERBUTALINE SULFATE 1 MG/ML IJ SOLN: 0.2500 mg | Freq: Once | INTRAMUSCULAR | Status: DC | PRN

## 2022-12-09 MED ORDER — EPHEDRINE 5 MG/ML INJ: 10.0000 mg | INTRAVENOUS | Status: DC | PRN

## 2022-12-09 MED ORDER — LACTATED RINGERS IV SOLN
500.0000 mL | Freq: Once | INTRAVENOUS | Status: AC
  Administered 2022-12-09: 500 mL via INTRAVENOUS

## 2022-12-09 MED ORDER — FENTANYL CITRATE (PF) 100 MCG/2ML IJ SOLN
INTRAMUSCULAR | Status: AC
  Administered 2022-12-09: 50 ug via INTRAVENOUS
  Filled 2022-12-09: qty 2

## 2022-12-09 MED ORDER — FENTANYL CITRATE (PF) 100 MCG/2ML IJ SOLN
INTRAMUSCULAR | Status: AC
  Filled 2022-12-09: qty 2

## 2022-12-09 MED ORDER — FENTANYL CITRATE (PF) 100 MCG/2ML IJ SOLN: 50.0000 ug | Freq: Once | INTRAMUSCULAR | Status: AC

## 2022-12-09 MED ORDER — PHENYLEPHRINE 80 MCG/ML (10ML) SYRINGE FOR IV PUSH (FOR BLOOD PRESSURE SUPPORT): 80.0000 ug | PREFILLED_SYRINGE | INTRAVENOUS | Status: DC | PRN

## 2022-12-09 MED ORDER — OXYTOCIN-SODIUM CHLORIDE 30-0.9 UT/500ML-% IV SOLN
1.0000 m[IU]/min | INTRAVENOUS | Status: DC
  Administered 2022-12-09 – 2022-12-10 (×2): 2 m[IU]/min via INTRAVENOUS
  Filled 2022-12-09 (×2): qty 500

## 2022-12-09 NOTE — Progress Notes (Signed)
Labor Progress Note Aleanna Jamil is a 21 y.o. G1P0 at [redacted]w[redacted]d presented for IOL for GHTN.  S: Patient is uncomfortable, requesting fentanyl. Does not feel ready for epidural yet, wants to wait for AROM until she gets epidural.  O:  BP 135/81   Pulse (!) 104   Temp 97.9 F (36.6 C) (Oral)   Resp 17   Ht 5\' 3"  (1.6 m)   Wt 123.4 kg   LMP 02/23/2022 (Approximate)   BMI 48.18 kg/m  EFM: baseline 130s/moderate variability/+ Accels, no decels Toco: q2-4 minutes, pt feeling but not tracing well  CVE: Dilation: 4 Effacement (%): 60 Cervical Position: Posterior Station: -3 Presentation: Vertex Exam by:: Polos RN   A&P: 21 y.o. G1P0 [redacted]w[redacted]d presents for IOL for gestational HTN. #Labor: Progressing well. FB out, pitocin started. Feeling her contractions but not wanting epidural yet. Consider AROM when patient desires or after epidural #Pain: PRN fentanyl, epidural when desires #FWB: Cat I #GBS negative  #Gestational HTN- blood pressures normotensive to mild range, patient asymptomatic, labs negative, UPCR 0.08.   #Hypothyroidism- continue home synthroid.  [redacted]w[redacted]d, MD Center for [facility], [facility] 6:03 PM

## 2022-12-09 NOTE — Anesthesia Procedure Notes (Signed)
Epidural Patient location during procedure: OB Start time: 12/09/2022 8:17 PM End time: 12/09/2022 8:20 PM  Staffing Anesthesiologist: Kaylyn Layer, MD Performed: anesthesiologist   Preanesthetic Checklist Completed: patient identified, IV checked, risks and benefits discussed, monitors and equipment checked, pre-op evaluation and timeout performed  Epidural Patient position: sitting Prep: DuraPrep and site prepped and draped Patient monitoring: continuous pulse ox, blood pressure and heart rate Approach: midline Location: L3-L4 Injection technique: LOR air  Needle:  Needle type: Tuohy  Needle gauge: 17 G Needle length: 9 cm Needle insertion depth: 8 cm Catheter type: closed end flexible Catheter size: 19 Gauge Catheter at skin depth: 13 cm Test dose: negative and Other (1% lidocaine)  Assessment Events: blood not aspirated, no cerebrospinal fluid, injection not painful, no injection resistance, no paresthesia and negative IV test  Additional Notes Patient identified. Risks, benefits, and alternatives discussed with patient including but not limited to bleeding, infection, nerve damage, paralysis, failed block, incomplete pain control, headache, blood pressure changes, nausea, vomiting, reactions to medication, itching, and postpartum back pain. Confirmed with bedside nurse the patient's most recent platelet count. Confirmed with patient that they are not currently taking any anticoagulation, have any bleeding history, or any family history of bleeding disorders. Patient expressed understanding and wished to proceed. All questions were answered. Sterile technique was used throughout the entire procedure. Please see nursing notes for vital signs.   Crisp LOR after one needle redirection. Test dose was given through epidural catheter and negative prior to continuing to dose epidural or start infusion. Warning signs of high block given to the patient including shortness of  breath, tingling/numbness in hands, complete motor block, or any concerning symptoms with instructions to call for help. Patient was given instructions on fall risk and not to get out of bed. All questions and concerns addressed with instructions to call with any issues or inadequate analgesia.  Reason for block:procedure for pain

## 2022-12-09 NOTE — Anesthesia Preprocedure Evaluation (Signed)
Anesthesia Evaluation  Patient identified by MRN, date of birth, ID band Patient awake    Reviewed: Allergy & Precautions, Patient's Chart, lab work & pertinent test results  History of Anesthesia Complications Negative for: history of anesthetic complications  Airway Mallampati: II  TM Distance: >3 FB Neck ROM: Full    Dental no notable dental hx.    Pulmonary asthma , sleep apnea , former smoker   Pulmonary exam normal        Cardiovascular hypertension (gestational), Normal cardiovascular exam     Neuro/Psych   Anxiety Depression    negative neurological ROS     GI/Hepatic negative GI ROS, Neg liver ROS,,,  Endo/Other  Hypothyroidism  Morbid obesity  Renal/GU negative Renal ROS  negative genitourinary   Musculoskeletal negative musculoskeletal ROS (+)    Abdominal   Peds  Hematology negative hematology ROS (+)   Anesthesia Other Findings Day of surgery medications reviewed with patient.  Reproductive/Obstetrics (+) Pregnancy                              Anesthesia Physical Anesthesia Plan  ASA: 3  Anesthesia Plan: Epidural   Post-op Pain Management:    Induction:   PONV Risk Score and Plan: Treatment may vary due to age or medical condition  Airway Management Planned: Natural Airway  Additional Equipment: Fetal Monitoring  Intra-op Plan:   Post-operative Plan:   Informed Consent: I have reviewed the patients History and Physical, chart, labs and discussed the procedure including the risks, benefits and alternatives for the proposed anesthesia with the patient or authorized representative who has indicated his/her understanding and acceptance.       Plan Discussed with:   Anesthesia Plan Comments:          Anesthesia Quick Evaluation

## 2022-12-09 NOTE — Progress Notes (Signed)
Labor Progress Note Joanne [name] is a 21 y.o. G1P0 at [redacted]w[redacted]d presented for IOL for GHTN.  S: Patient is uncomfortable, requesting fentanyl.  O:  BP 125/78   Pulse 94   Temp 97.7 F (36.5 C) (Oral)   Resp 17   Ht 5\' 3"  (1.6 m)   Wt 123.4 kg   LMP 02/23/2022 (Approximate)   BMI 48.18 kg/m  EFM: baseline 120s/moderate variability/+ Accels, no decels Toco: q4-5 minutes  CVE: Dilation: 1 Effacement (%): 80 Cervical Position: Posterior Station: -3 Presentation: Vertex Exam by:: Dr. [email or web address]   A&P: 21 y.o. G1P0 [redacted]w[redacted]d presents for IOL for gestational HTN. #Labor: Progressing well. FB in place since 0400, no cytotec since 0002. Consider another dose of cytotec vs low dose pitocin after patient eats breakfast if FB still in place. #Pain: PRN fentanyl, epidural when desires #FWB: Cat I #GBS negative  #Gestational HTN- blood pressures normotensive, patient asymptomatic, labs negative, UPCR 0.08.   #Hypothyroidism- continue home synthroid.  0003, MD Center for [facility], [facility] 8:57 AM

## 2022-12-09 NOTE — Progress Notes (Signed)
Labor Progress Note Joanne [name] is a 21 y.o. G1P0 at [redacted]w[redacted]d presented for IOL for GHTN.  S: Patient is uncomfortable, requesting fentanyl.  O:  BP (!) 141/93   Pulse (!) 104   Temp 97.9 F (36.6 C) (Oral)   Resp 17   Ht 5\' 3"  (1.6 m)   Wt 123.4 kg   LMP 02/23/2022 (Approximate)   BMI 48.18 kg/m  EFM: baseline 135/moderate variability/+ Accels, no decels Toco: q2-4 minutes  CVE: Dilation: 3.5 Effacement (%): 60 Cervical Position: Posterior Station: Ballotable Presentation: Vertex Exam by:: Polos RN   A&P: 21 y.o. G1P0 [redacted]w[redacted]d presents for IOL for gestational HTN. #Labor: Progressing well. FB out, pitocin started. Feeling her contractions but not wanting epidural yet. #Pain: PRN fentanyl, epidural when desires #FWB: Cat I #GBS negative  #Gestational HTN- blood pressures normotensive to mild range, patient asymptomatic, labs negative, UPCR 0.08.   #Hypothyroidism- continue home synthroid.  [redacted]w[redacted]d, MD Center for [facility], [facility] 2:01 PM

## 2022-12-10 SURGERY — Surgical Case
Anesthesia: *Unknown

## 2022-12-10 NOTE — Progress Notes (Signed)
Labor Progress Note Joanne [name] is a 21 y.o. G1P0 at [redacted]w[redacted]d presented for IOL for GHTN.  S: Comfortable with epidural in place.  No concerns at this time.  O:  BP 124/60   Pulse (!) 104   Temp 97.7 F (36.5 C) (Oral)   Resp 16   Ht 5\' 3"  (1.6 m)   Wt 123.4 kg   LMP 02/23/2022 (Approximate)   SpO2 100%   BMI 48.18 kg/m  EFM: baseline 135/moderate variability/+ Accels, no decels Toco: q2-4 minutes, pt feeling but not tracing well  CVE: Dilation: 4 Effacement (%): 60 Cervical Position: Posterior Station: -3 Presentation: Vertex Exam by:: Dr. [email or web address]   A&P: 21 y.o. G1P0 [redacted]w[redacted]d presents for IOL for gestational HTN. #Labor: Progressing well.  Epidural in place.  AROM at this check with clear fluid.  IUPC placed for better tracing of contractions. #Pain: Epidural in place #FWB: Cat I #GBS negative  #Gestational HTN- blood pressures normotensive to mild range, patient asymptomatic, labs negative, UPCR 0.08.   #Hypothyroidism- continue home synthroid.  [redacted]w[redacted]d, MD Center for [facility], [facility] 2:58 AM

## 2022-12-10 NOTE — Progress Notes (Signed)
Labor Progress Note Joanne [name] is a 21 y.o. G1P0 at [redacted]w[redacted]d presented for IOL for gHTN. S: Patient is resting comfortably.  O:  BP 127/75   Pulse 87   Temp 97.7 F (36.5 C)   Resp 18   Ht 5\' 3"  (1.6 m)   Wt 123.4 kg   LMP 02/23/2022 (Approximate)   SpO2 100%   BMI 48.18 kg/m   CVE: Dilation: 5 Effacement (%): 90 Cervical Position: Posterior Station: -2 Presentation: Vertex Exam by:: [email or web address] RN  A&P: 21 y.o. G1P0 [redacted]w[redacted]d here for IOL gHTN. #Labor: Progressing well. On pitocin break given little contraction change. Restart pitocin at 1100 and assess further change. #Pain: Epidural #FWB: Cat 1 #GBS negative #gHTN: BP normal-soft ranges. Labs previously normal. #Hypothyroidism: continue synthroid  [redacted]w[redacted]d, MD Center for [facility], [facility] 9:36 AM

## 2022-12-10 NOTE — Progress Notes (Signed)
Patient ID: Joanne [name], female   DOB: Sep 29, 2001, 21 y.o.   MRN: [id number]  Patient comfortable with contractions. Had Pit break for several hours.  Just restarted pitocin.  BP (!) 140/70   Pulse 92   Temp 98.1 F (36.7 C) (Oral)   Resp 16   Ht 5\' 3"  (1.6 m)   Wt 123.4 kg   LMP 02/23/2022 (Approximate)   SpO2 100%   BMI 48.18 kg/m   Dilation: 5 Effacement (%): 90 Cervical Position: Posterior Station: -2 Presentation: Vertex Exam by:: [email or web address] RN  IUPC placed.  Cat 1 tracing.  Raliegh Ip, DO

## 2022-12-11 ENCOUNTER — Inpatient Hospital Stay (HOSPITAL_COMMUNITY): Payer: Medicaid Other

## 2022-12-11 ENCOUNTER — Encounter: Payer: Medicaid Other | Admitting: Obstetrics & Gynecology

## 2022-12-11 ENCOUNTER — Other Ambulatory Visit: Payer: Medicaid Other

## 2022-12-11 ENCOUNTER — Encounter (HOSPITAL_COMMUNITY): Payer: Self-pay | Admitting: Obstetrics & Gynecology

## 2022-12-11 DIAGNOSIS — O134 Gestational [pregnancy-induced] hypertension without significant proteinuria, complicating childbirth: Secondary | ICD-10-CM

## 2022-12-11 DIAGNOSIS — O99284 Endocrine, nutritional and metabolic diseases complicating childbirth: Secondary | ICD-10-CM

## 2022-12-11 DIAGNOSIS — O24424 Gestational diabetes mellitus in childbirth, insulin controlled: Secondary | ICD-10-CM

## 2022-12-11 DIAGNOSIS — Z3A39 39 weeks gestation of pregnancy: Secondary | ICD-10-CM

## 2022-12-11 DIAGNOSIS — Z8759 Personal history of other complications of pregnancy, childbirth and the puerperium: Secondary | ICD-10-CM

## 2022-12-11 DIAGNOSIS — O99344 Other mental disorders complicating childbirth: Secondary | ICD-10-CM

## 2022-12-11 LAB — CBC WITH DIFFERENTIAL/PLATELET
Abs Immature Granulocytes: 0.2 10*3/uL — ABNORMAL HIGH (ref 0.00–0.07)
Basophils Absolute: 0 10*3/uL (ref 0.0–0.1)
Basophils Relative: 0 %
Eosinophils Absolute: 0 10*3/uL (ref 0.0–0.5)
Eosinophils Relative: 0 %
HCT: 30.1 % — ABNORMAL LOW (ref 36.0–46.0)
Hemoglobin: 10.9 g/dL — ABNORMAL LOW (ref 12.0–15.0)
Immature Granulocytes: 1 %
Lymphocytes Relative: 7 %
Lymphs Abs: 1.7 10*3/uL (ref 0.7–4.0)
MCH: 33.1 pg (ref 26.0–34.0)
MCHC: 36.2 g/dL — ABNORMAL HIGH (ref 30.0–36.0)
MCV: 91.5 fL (ref 80.0–100.0)
Monocytes Absolute: 1.9 10*3/uL — ABNORMAL HIGH (ref 0.1–1.0)
Monocytes Relative: 8 %
Neutro Abs: 20.5 10*3/uL — ABNORMAL HIGH (ref 1.7–7.7)
Neutrophils Relative %: 84 %
Platelets: 166 10*3/uL (ref 150–400)
RBC: 3.29 MIL/uL — ABNORMAL LOW (ref 3.87–5.11)
RDW: 12.6 % (ref 11.5–15.5)
WBC: 24.3 10*3/uL — ABNORMAL HIGH (ref 4.0–10.5)
nRBC: 0 % (ref 0.0–0.2)

## 2022-12-11 MED ORDER — LEVOCETIRIZINE DIHYDROCHLORIDE 5 MG PO TABS: 5.0000 mg | ORAL_TABLET | Freq: Every day | ORAL | Status: DC

## 2022-12-11 MED ORDER — FLUTICASONE PROPIONATE 50 MCG/ACT NA SUSP
1.0000 | Freq: Every day | NASAL | Status: DC
  Administered 2022-12-11 – 2022-12-12 (×2): 1 via NASAL
  Filled 2022-12-11: qty 16

## 2022-12-11 MED ORDER — LACTATED RINGERS AMNIOINFUSION: INTRAVENOUS | Status: DC

## 2022-12-11 MED ORDER — OXYCODONE HCL 5 MG PO TABS
5.0000 mg | ORAL_TABLET | ORAL | Status: DC | PRN
  Administered 2022-12-11 – 2022-12-12 (×2): 5 mg via ORAL
  Filled 2022-12-11 (×2): qty 1

## 2022-12-11 MED ORDER — OXYCODONE HCL 5 MG PO TABS
10.0000 mg | ORAL_TABLET | ORAL | Status: DC | PRN
  Administered 2022-12-12: 10 mg via ORAL
  Filled 2022-12-11: qty 2

## 2022-12-11 MED ORDER — ONDANSETRON HCL 4 MG/2ML IJ SOLN: 4.0000 mg | INTRAMUSCULAR | Status: DC | PRN

## 2022-12-11 MED ORDER — PRENATAL + COMPLETE MULTI 0.267 & 373 MG PO THPK: 1.0000 | PACK | Freq: Every day | ORAL | Status: DC

## 2022-12-11 MED ORDER — LIDOCAINE HCL 1 % IJ SOLN: 0.0000 mL | Freq: Once | INTRAMUSCULAR | Status: DC | PRN

## 2022-12-11 MED ORDER — IBUPROFEN 600 MG PO TABS
600.0000 mg | ORAL_TABLET | Freq: Four times a day (QID) | ORAL | Status: DC
  Administered 2022-12-11 – 2022-12-13 (×8): 600 mg via ORAL
  Filled 2022-12-11 (×8): qty 1

## 2022-12-11 MED ORDER — SIMETHICONE 80 MG PO CHEW: 80.0000 mg | CHEWABLE_TABLET | ORAL | Status: DC | PRN

## 2022-12-11 MED ORDER — GENTAMICIN SULFATE 40 MG/ML IJ SOLN
5.0000 mg/kg | Freq: Once | INTRAVENOUS | Status: AC
  Administered 2022-12-11: 400 mg via INTRAVENOUS
  Filled 2022-12-11: qty 10

## 2022-12-11 MED ORDER — ONDANSETRON HCL 4 MG PO TABS: 4.0000 mg | ORAL_TABLET | ORAL | Status: DC | PRN

## 2022-12-11 MED ORDER — COMPLETENATE 29-1 MG PO CHEW
1.0000 | CHEWABLE_TABLET | Freq: Every day | ORAL | Status: DC
  Administered 2022-12-11 – 2022-12-12 (×2): 1 via ORAL
  Filled 2022-12-11 (×2): qty 1

## 2022-12-11 MED ORDER — DIBUCAINE (PERIANAL) 1 % EX OINT: 1.0000 | TOPICAL_OINTMENT | CUTANEOUS | Status: DC | PRN

## 2022-12-11 MED ORDER — SODIUM CHLORIDE 0.9 % IV SOLN
2.0000 g | Freq: Four times a day (QID) | INTRAVENOUS | Status: DC
  Administered 2022-12-11 – 2022-12-12 (×5): 2 g via INTRAVENOUS
  Filled 2022-12-11 (×5): qty 2000

## 2022-12-11 MED ORDER — POLYETHYLENE GLYCOL 3350 17 G PO PACK
17.0000 g | PACK | Freq: Every day | ORAL | Status: DC
  Administered 2022-12-12: 17 g via ORAL
  Filled 2022-12-11 (×2): qty 1

## 2022-12-11 MED ORDER — WITCH HAZEL-GLYCERIN EX PADS: 1.0000 | MEDICATED_PAD | CUTANEOUS | Status: DC | PRN

## 2022-12-11 MED ORDER — ACETAMINOPHEN 325 MG PO TABS
650.0000 mg | ORAL_TABLET | ORAL | Status: DC | PRN
  Administered 2022-12-11 (×2): 650 mg via ORAL
  Filled 2022-12-11 (×2): qty 2

## 2022-12-11 MED ORDER — BENZOCAINE-MENTHOL 20-0.5 % EX AERO
1.0000 | INHALATION_SPRAY | CUTANEOUS | Status: DC | PRN
  Administered 2022-12-11: 1 via TOPICAL
  Filled 2022-12-11: qty 56

## 2022-12-11 MED ORDER — BUSPIRONE HCL 5 MG PO TABS
7.5000 mg | ORAL_TABLET | Freq: Two times a day (BID) | ORAL | Status: DC
  Administered 2022-12-11 – 2022-12-12 (×3): 7.5 mg via ORAL
  Filled 2022-12-11 (×3): qty 2

## 2022-12-11 MED ORDER — BUSPIRONE HCL 5 MG PO TABS: 7.5000 mg | ORAL_TABLET | Freq: Two times a day (BID) | ORAL | Status: DC

## 2022-12-11 MED ORDER — FUROSEMIDE 20 MG PO TABS
20.0000 mg | ORAL_TABLET | Freq: Two times a day (BID) | ORAL | Status: DC
  Administered 2022-12-12 – 2022-12-13 (×3): 20 mg via ORAL
  Filled 2022-12-11 (×3): qty 1

## 2022-12-11 MED ORDER — SODIUM CHLORIDE 0.9% FLUSH: 3.0000 mL | INTRAVENOUS | Status: DC | PRN

## 2022-12-11 MED ORDER — SODIUM CHLORIDE 0.9 % IV SOLN: 250.0000 mL | INTRAVENOUS | Status: DC | PRN

## 2022-12-11 MED ORDER — ETONOGESTREL 68 MG ~~LOC~~ IMPL: 68.0000 mg | DRUG_IMPLANT | Freq: Once | SUBCUTANEOUS | Status: DC

## 2022-12-11 MED ORDER — COCONUT OIL OIL: 1.0000 | TOPICAL_OIL | Status: DC | PRN

## 2022-12-11 MED ORDER — LORATADINE 10 MG PO TABS
10.0000 mg | ORAL_TABLET | Freq: Every day | ORAL | Status: DC
  Administered 2022-12-12: 10 mg via ORAL
  Filled 2022-12-11 (×2): qty 1

## 2022-12-11 MED ORDER — SODIUM CHLORIDE 0.9% FLUSH
3.0000 mL | Freq: Two times a day (BID) | INTRAVENOUS | Status: DC
  Administered 2022-12-12: 3 mL via INTRAVENOUS

## 2022-12-11 NOTE — Progress Notes (Signed)
Labor Progress Note  LATE ENTRY  Joanne [name] is a 21 y.o. G1P0 at [redacted]w[redacted]d presented for IOL [facility]  S: Pt ~2000 had been feeling more pressure but was tolerating with epidural.  ~ 2330 pt feeling pressure, was noted to be 9.5cm dilated. At ~ 0000 pt was complete and started pushing but with minimal progress.   O:  BP (!) 136/107   Pulse (!) 119   Temp 97.7 F (36.5 C) (Oral)   Resp 17   Ht 5\' 3"  (1.6 m)   Wt 123.4 kg   LMP 02/23/2022 (Approximate)   SpO2 100%   BMI 48.18 kg/m  EFM: 145bpm/Moderate variability/ 15x15 accels/ Early decels  CVE: Dilation: 10 Dilation Complete Date: 12/10/22 Dilation Complete Time: 2358 Effacement (%): 90 Cervical Position: Posterior Station: Plus 1 Presentation: Vertex Exam by:: [email or web address], RN   A&P: 21 y.o. G1P0 [redacted]w[redacted]d  here for IOL as above  #Labor: Progressing well. Labor down in throne position #Pain: Epidural #FWB: CAT 1 #GBS negative #gHTN: BP elevated, not severe range. Pre E labs wnl. CTM  [redacted]w[redacted]d, DO FMOB Fellow, Faculty practice [facility], Center for [facility] 12/11/22  12:43 AM

## 2022-12-11 NOTE — Anesthesia Postprocedure Evaluation (Signed)
Anesthesia Post Note  Patient: Joanne [name]  Procedure(s) Performed: AN AD HOC LABOR EPIDURAL     Patient location during evaluation: Mother Baby Anesthesia Type: Epidural Level of consciousness: awake and alert Pain management: pain level controlled Vital Signs Assessment: post-procedure vital signs reviewed and stable Respiratory status: spontaneous breathing, nonlabored ventilation and respiratory function stable Cardiovascular status: stable Postop Assessment: no headache, no backache and epidural receding Anesthetic complications: no   No notable events documented.  Last Vitals:  Vitals:   12/11/22 0700 12/11/22 0800  BP: 128/81 122/77  Pulse: (!) 105 (!) 103  Resp: 20 18  Temp: 36.8 C 36.6 C  SpO2: 99% 98%    Last Pain:  Vitals:   12/11/22 0800  TempSrc: Oral  PainSc: 9    Pain Goal:                   Chayton Murata

## 2022-12-11 NOTE — Progress Notes (Signed)
   12/11/22 1330  Clinical Encounter Type  Visited With Patient  Visit Type Initial;Spiritual support  Referral From Nurse  Consult/Referral To Chaplain   Chaplain responded to a request to visit from the over night chaplain.  The patient was not feeling very well, when I stopped in and she had family with her. She asked that I come back later. I will continue to monitor.   [name] [facility]  [phone]

## 2022-12-11 NOTE — Discharge Summary (Signed)
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 yo G1P0 at 355w4das admitted to the hospital 12/08/2022 for induction of labor.  Indication for induction: Gestational hypertension.  Patient had an labor course complicated by prolonged active phase of labor, terminal meconium.  Membrane Rupture Time/Date: 1:36 AM ,12/10/2022   Delivery Method:Vaginal, Vacuum (Extractor)  Episiotomy: None  Lacerations:  1st degree;Perineal  Details of delivery can be found in separate delivery note.  Patient had a postpartum course complicated by nothing. Patient is discharged home 12/13/22.  Newborn Data: Birth date:12/11/2022  Birth time:3:38 AM  Gender:Female  Living status:Living  Apgars:1 ,2  Weight:3160 g   Magnesium Sulfate received: No BMZ received: No Rhophylac:ordered MMR:No T-DaP:Given prenatally Flu: Yes Transfusion:No  Physical exam  Vitals:   12/12/22 0534 12/12/22 1449 12/12/22 2150 12/13/22 0602  BP: (!) 118/51 109/65 (!) 108/48 100/76  Pulse: 85 (!) 110 (!) 107 (!) 109  Resp: _0 Temp: 97.6 F (36.4 C) 98.4 F (36.9 C) 98.2 F (36.8 C) 98 F (36.7 C)   TempSrc: Oral Oral Oral Oral  SpO2: 98% 98% 99% 100%  Weight:      Height:       General: alert, cooperative, and no distress Lochia: appropriate Uterine Fundus: firm Incision: N/A DVT Evaluation: No evidence of DVT seen on physical exam. Negative Homan's sign. No cords or calf tenderness. No significant calf/ankle edema. Labs: Lab Results  Component Value Date   WBC 13.0 (H) 12/12/2022   HGB 10.0 (L) 12/12/2022   HCT 28.5 (L) 12/12/2022   MCV 92.8 12/12/2022   PLT 149 (L) 12/12/2022      Latest Ref Rng & Units 12/08/2022    1:27 PM  CMP  Glucose 70 - 99 mg/dL 66   BUN 6 - 20 mg/dL 9   Creatinine 0.44 - 1.00 mg/dL 0.48   Sodium 135 - 145 mmol/L 137   Potassium 3.5 - 5.1 mmol/L 3.9   Chloride 98 - 111 mmol/L 110   CO2 22 - 32 mmol/L 19   Calcium 8.9 - 10.3 mg/dL 8.7   Total Protein 6.5 - 8.1 g/dL 5.9   Total Bilirubin 0.3 - 1.2 mg/dL 0.4   Alkaline Phos 38 - 126 U/L 92   AST 15 - 41 U/L 17   ALT 0 - 44 U/L 13    Edinburgh Score:    12/12/2022    5:01 PM  Edinburgh Postnatal Depression Scale Screening Tool  I have been able to laugh and see the funny side of  things. 1  I have looked forward with enjoyment to things. 2  I have blamed myself unnecessarily when things went wrong. 3  I have been anxious or worried for no good reason. 0  I have felt scared or panicky for no good reason. 0  Things have been getting on top of me. 3  I have been so unhappy that I have had difficulty sleeping. 2  I have felt sad or miserable. 2  I have been so unhappy that I have been crying. 3  The thought of harming myself has occurred to me. 0  Edinburgh Postnatal Depression Scale Total 16     After visit meds:  Allergies as of 12/13/2022       Reactions   Red Dye Rash   Tums [calcium Carbonate Antacid] Rash        Medication List     STOP taking these medications    FAMOTIDINE PO   Prenatal + Complete Multi 0.267 & 373 MG Thpk       TAKE these medications     acetaminophen 325 MG tablet Commonly known as: Tylenol Take 2 tablets (650 mg total) by mouth every 4 (four) hours as needed (for pain scale < 4).   fluticasone 50 MCG/ACT nasal spray Commonly known as: FLONASE Place 1 spray into both nostrils daily. What changed:  when to take this reasons to take this   furosemide 20 MG tablet Commonly known as: LASIX Take 1 tablet (20 mg total) by mouth 2 (two) times daily.   ibuprofen 600 MG tablet Commonly known as: ADVIL Take 1 tablet (600 mg total) by mouth every 6 (six) hours.   levothyroxine 50 MCG tablet Commonly known as: SYNTHROID TAKE 1 TABLET BY MOUTH DAILY   PRENATAL PO Take 1 tablet by mouth daily.         Discharge home in stable condition Infant Feeding: Bottle Infant Disposition:NICU Discharge instruction: per After Visit Summary and Postpartum booklet. Activity: Advance as tolerated. Pelvic rest for 6 weeks.  Diet: routine diet Future Appointments: Future Appointments  Date Time Provider [place]  12/18/2022 10:50 AM [facility] NURSE [facility]  01/22/2023 10:30 AM Myrtis Ser, CNM [facility]   Follow up Visit: Message sent to FT on 12/20  Please schedule this patient for a In person postpartum visit in 6 weeks with the following provider: Any provider. Additional Postpartum F/U:Postpartum Depression checkup and BP check 1 week  High risk pregnancy complicated by: HTN Delivery mode:  Vaginal, Vacuum Neurosurgeon)  Anticipated Birth Control:  Nexplanon   12/13/2022 Christin Fudge, CNM

## 2022-12-11 NOTE — Lactation Note (Signed)
This note was copied from a baby's chart. Lactation Consultation Note  Patient Name: Boy Navea Woodrow HYHOO'I Date: 12/11/2022 Reason for consult: Initial assessment;NICU baby;1st time breastfeeding;Primapara;Term (Mom sound asleep. LC checked with Johnnette Litter and she mentioned a DEBP was set up and mom pumped x 1.) Age:19 hours  Maternal Data    Feeding Mother's Current Feeding Choice: Breast Milk and Formula   Lactation Tools Discussed/Used Tools: Pump (MBURN set up the DEBP) Breast pump type: Double-Electric Breast Pump   Consult Status Consult Status: Follow-up Date: 12/11/22 Follow-up type: In-patient    Matilde Sprang Glenn Gul[place] 12/11/2022, 6:51 PM

## 2022-12-11 NOTE — Progress Notes (Signed)
Chaplain responded to Neonatal Code at around 3 am. The patient was still receiving care from doctors, post delivery.  The baby was receiving CPR.  The baby's father was not in room but came later.  He said he had to "take a breather" because he had Aspergers and the commotion in the room was too much sensory intake.  He was gentle with his partner, rubbing her hair and saying "I love you."  Chaplain asked staff if she should baptize baby "Joanne [name]" but it was declined.  They asked for a simple prayer.  Chaplain offered prayer with them for Joanne [name] and for the parents. Rev. Lynnell Chad Pager [phone]

## 2022-12-11 NOTE — Progress Notes (Signed)
Patient will let staff know if and when she is ready to pump. Royston Cowper, RN

## 2022-12-12 ENCOUNTER — Encounter (HOSPITAL_COMMUNITY): Payer: Self-pay | Admitting: Obstetrics & Gynecology

## 2022-12-12 LAB — CBC
HCT: 28.5 % — ABNORMAL LOW (ref 36.0–46.0)
Hemoglobin: 10 g/dL — ABNORMAL LOW (ref 12.0–15.0)
MCH: 32.6 pg (ref 26.0–34.0)
MCHC: 35.1 g/dL (ref 30.0–36.0)
MCV: 92.8 fL (ref 80.0–100.0)
Platelets: 149 10*3/uL — ABNORMAL LOW (ref 150–400)
RBC: 3.07 MIL/uL — ABNORMAL LOW (ref 3.87–5.11)
RDW: 13.1 % (ref 11.5–15.5)
WBC: 13 10*3/uL — ABNORMAL HIGH (ref 4.0–10.5)
nRBC: 0 % (ref 0.0–0.2)

## 2022-12-12 LAB — SURGICAL PATHOLOGY

## 2022-12-12 MED ORDER — LIDOCAINE HCL 1 % IJ SOLN: 0.0000 mL | Freq: Once | INTRAMUSCULAR | Status: DC | PRN

## 2022-12-12 MED ORDER — ETONOGESTREL 68 MG ~~LOC~~ IMPL: 68.0000 mg | DRUG_IMPLANT | Freq: Once | SUBCUTANEOUS | Status: DC

## 2022-12-12 NOTE — Lactation Note (Signed)
This note was copied from a baby's chart.  NICU Lactation Consultation Note  Patient Name: Boy Joanne [name] Today's Date: 12/12/2022 Age:19 hours  Subjective Reason for consult: Initial assessment; Primapara; 1st time breastfeeding; NICU baby; Term; Breast reduction  Lactation conducted an initial consult with Joanne [name] in her room on MBU. Parent states that she has pumped three times thus far and has not seen any milk. She states that she had a breast reduction surgery two years ago (age 21), and it was an anchor incision.  We discussed the possibility that she may be able to produce milk and the relative risk that she would be unable to produce a full supply. [name] states that she would like baby to receive as much breast milk as possible, particularly due to infant's high acuity. She is also interested in discussing the option to use donor breast milk. I indicated that I would refer her to her NICU RN for this discussion.  I showed [name] how to hand express, reviewed pumping process, and pump cleaning guidelines. I recommended that she pump q3 hours, as physically able.  I stated that I would put in a pump request with WIC (and [facility] request). I also made parent and family aware of the [facility] loaner pump program. They will page if they wish to participate prior to discharge on Friday 12/22.  Objective Infant data: Mother's Current Feeding Choice: Breast Milk and Donor Milk  Maternal data: G1P1001  Vaginal, Vacuum (Extractor) Significant Breast History:: reduction 2 years ago  Current breast feeding challenges:: NICU; breast reduction  Pumping frequency: 3 times so far (first 35 hours); recommend q3 hours, as parent is able Pumped volume: 0 mL  Risk factor for low milk supply:: breast reduction   WIC Program: Yes WIC Referral Sent?: Yes Pump: DEBP  Assessment Feeding Status: NPO   Maternal: Milk volume: Normal   Intervention/Plan Interventions: Breast feeding basics  reviewed; Hand express; DEBP; Education  Tools: Pump Pump Education: Setup, frequency, and cleaning  Plan: Consult Status: NICU follow-up  NICU Follow-up type: New admission follow up    Walker Shadow 12/12/2022, 3:35 PM

## 2022-12-12 NOTE — Progress Notes (Signed)
   12/12/22 1300  Clinical Encounter Type  Visited With Patient and family together  Visit Type Follow-up;Spiritual support  Referral From Chaplain  Consult/Referral To Chaplain   Chaplain followed up with the patient, Joanne [name] today. She was smiling and more talkative today. Family and friends were present and providing positive support to the patient. They have been critical for her recovery. [name] expects to go home tomorrow and this comes with its own feelings that are mixed because her baby, Joanne [name] is not ready to go home yet. The patient understands she is facing a lot of obstacles but with support will be ok.   [name] [facility] [phone]

## 2022-12-12 NOTE — Progress Notes (Addendum)
Tessla Oehler 10-03-2001 [id number]   CIRCUMCISION CONSENT  Patient expresses desire for infant circumcision.  Informed that [facility] can perform said procedure and circumcision procedure details discussed.    -It was emphasized that this is an elective procedure.   -Risks and benefits of procedure were reviewed including, but not limited to:  *Benefits include reduction in the rates of urinary tract infection (UTI), penile cancer, some sexually transmitted infections, penile inflammatory, and retractile disorders, as well as easier hygiene.   *Risks include bleeding, infection, injury of glans which may lead to need for additional surgery, penile deformity, or urinary tract issues, unsatisfactory cosmetic appearance and other potential complications related to the procedure.   -Informed that procedure will not be performed if provider deems inappropriate d/t penile size, noted deformity, or unsatisfactory pediatric evaluation. -Patient wants to proceed with circumcision. -Circumcision to be done pending pediatric evaluation and/or NICU discharge of infant.  -Post circumcision care discussed. -L&D Team updated  Cherre Robins MSN, CNM Advanced Practice Provider, Center for [facility] 12/12/2022 12:35 PM

## 2022-12-12 NOTE — Progress Notes (Signed)
Patient ID: Joanne [name], female   DOB: [birth year], 21 y.o.   MRN: [id number]  Post Partum Day One :S/P VAVD with First Degree Perineal Laceration Repair  Subjective: Patient up ad lib, denies syncope or dizziness. Reports consuming regular diet without issues and denies N/V. Denies issues with urination and reports bleeding is "like an octupus, so strings every time I move."  Patient is bottle feeding, but reports infant is doing well.  Desires Nexplanon for postpartum contraception.  Pain is being appropriately managed with use of Oxycodone and Ibuprofren.  Objective: Vitals:   12/11/22 1330 12/11/22 1735 12/11/22 2227 12/12/22 0534  BP: 117/79 (!) 98/54 106/66 (!) 118/51  Pulse: 89 90 98 85  Resp: 20 20 16 16   Temp: (!) 97.5 F (36.4 C) 97.6 F (36.4 C) 97.8 F (36.6 C) 97.6 F (36.4 C)  TempSrc: Axillary Axillary Oral Oral  SpO2: 99% 99% 99% 98%  Weight:      Height:       Recent Labs    12/11/22 0726 12/12/22 0521  HGB 10.9* 10.0*  HCT 30.1* 28.5*    Physical Exam:  General: alert, cooperative, and no distress Mood/Affect: Appropriate/Appropriate Lungs: clear to auscultation, no wheezes, rales or rhonchi, symmetric air entry.  Heart: normal rate and regular rhythm. Breast: Not examined. Abdomen:  + bowel sounds, Soft, NT Uterine Fundus: firm at umbilicus Lochia: appropriate Laceration: Not Assessed Skin: Warm, Dry DVT Evaluation: No evidence of DVT seen on physical exam. No significant calf/ankle edema.  Assessment S/P Vaginal Delivery-Day One Normal Involution Bottle Feeding   Plan: -Discussed R/B of Nexplanon insertion. -Patient declines and requests Nexplanon insertion in office at 6 week visit.  -Circumcision consent completed (see separate note) -BP and HgB stable. -Plan for discharge tomorrow. -Scheduled for BP and Mood Check in one week.  Continue current care  12/14/22, MSN, CNM 12/12/2022, 11:23 AM

## 2022-12-13 ENCOUNTER — Other Ambulatory Visit (HOSPITAL_COMMUNITY): Payer: Self-pay

## 2022-12-13 MED ORDER — FUROSEMIDE 20 MG PO TABS
20.0000 mg | ORAL_TABLET | Freq: Two times a day (BID) | ORAL | 0 refills | Status: DC
  Filled 2022-12-13: qty 6, 3d supply, fill #0

## 2022-12-13 MED ORDER — FUROSEMIDE 20 MG PO TABS
20.0000 mg | ORAL_TABLET | Freq: Two times a day (BID) | ORAL | 0 refills | Status: DC
  Filled 2022-12-13: qty 3, 2d supply, fill #0

## 2022-12-13 MED ORDER — IBUPROFEN 600 MG PO TABS
600.0000 mg | ORAL_TABLET | Freq: Four times a day (QID) | ORAL | 0 refills | Status: DC
  Filled 2022-12-13: qty 30, 8d supply, fill #0

## 2022-12-13 MED ORDER — ACETAMINOPHEN 325 MG PO TABS: 650.0000 mg | ORAL_TABLET | ORAL | Status: DC | PRN

## 2022-12-13 NOTE — Lactation Note (Signed)
This note was copied from a baby's chart. Lactation Consultation Note  Patient Name: Joanne [name] [id number] Date: 12/13/2022 Reason for consult: Follow-up assessment;Primapara;1st time breastfeeding;NICU baby;Term;Breast reduction;Maternal endocrine disorder;Other (Comment) (19 yr old Mom) Age:98 hours  LC in to visit with 88 yr old P82 Mom of baby in the NICU.  Mom states she has been pumping but not consistently.  Encouraged pumping every 2-3 hrs when awaken, sleeping when she can.    2 yrs ago, Mom had breast reduction surgery, + breast changes in early pregnancy per Mom.  LC noticed pump parts attached to pump and in bag hanging on pump.  While Mom was on the phone, LC removed pump parts and later educated Mom about the importance of disassembling all the parts, washing, rinsing and air drying in separate bin provided after each use.  Mom states she will be getting a DEBP from [facility] today.  Pump in baby's room is also an option and Mom is aware.  Showed how to remove all the pump parts from pump upon discharge.  Engorgement prevention and treatment reviewed.  Small colostrum bottle provided and encouraged hand expressing along with regular pumping to collect drops of colostrum to be given to baby.  Suggested she ask baby's RN about breast milk labels to accurately label when she expressed her milk.  Mom denies any further questions.   Lactation Tools Discussed/Used Tools: Pump;Flanges Breast pump type: Double-Electric Breast Pump Pump Education: Setup, frequency, and cleaning;Milk Storage Reason for Pumping: Support milk supply/infant in NICU Pumping frequency: Encouraged consistently pumping every 3 hrs or 8 times per 24 hrs Pumped volume: 0 mL (drops)  Interventions Interventions: Breast feeding basics reviewed;Skin to skin;Breast massage;Hand express;DEBP;Education  Discharge Discharge Education: Engorgement and breast care;Warning signs for feeding baby Pump: DEBP WIC  Program: Yes  Consult Status Consult Status: NICU follow-up Date: 12/14/22 Follow-up type: In-patient    Joanne [name] 12/13/2022, 9:02 AM

## 2022-12-13 NOTE — Progress Notes (Signed)
CSW acknowledged consult and attempted to meet with MOB; however, MOB was already discharged when CSW arrived to the room. CSW will meet with MOB at a later time as infant continues to be admitted to the NICU.   Nickalous Stingley, LCSW Clinical Social Worker [facility] Cell#: [phone] 

## 2022-12-14 ENCOUNTER — Encounter: Payer: Self-pay | Admitting: Obstetrics & Gynecology

## 2022-12-14 ENCOUNTER — Other Ambulatory Visit: Payer: Self-pay

## 2022-12-14 ENCOUNTER — Emergency Department (HOSPITAL_COMMUNITY)
Admission: EM | Admit: 2022-12-14 | Discharge: 2022-12-14 | Disposition: A | Discharge status: Home / Self Care | Payer: Medicaid Other | Source: Home / Self Care

## 2022-12-17 ENCOUNTER — Telehealth (HOSPITAL_COMMUNITY): Payer: Self-pay | Admitting: *Deleted

## 2022-12-17 DIAGNOSIS — Z1331 Encounter for screening for depression: Secondary | ICD-10-CM

## 2022-12-17 NOTE — Telephone Encounter (Signed)
IBH referral placed for EPDS score of 16 in hospital.

## 2022-12-18 ENCOUNTER — Other Ambulatory Visit (HOSPITAL_COMMUNITY): Payer: Self-pay

## 2022-12-18 ENCOUNTER — Encounter: Payer: Self-pay | Admitting: *Deleted

## 2022-12-18 ENCOUNTER — Ambulatory Visit (INDEPENDENT_AMBULATORY_CARE_PROVIDER_SITE_OTHER): Payer: Medicaid Other | Admitting: Obstetrics & Gynecology

## 2022-12-18 NOTE — Progress Notes (Signed)
    Postpartum Visit Note  Joanne [name] is a 21 y.o. G70P1001 female who presents for a postoperative visit. She is 1 week s/p NSVD.  Pt presented for a BP check; however, she was concerned about her "vagina."  She notes sharp pain at her perineum and is concerned about an infection.  No fever or chills.  Lochia appropriate.  No discharge or odor.  No other acute complaints.  Review of Systems Pertinent items are noted in HPI.    Objective:  BP 129/87 (BP Location: Left Arm, Patient Position: Sitting, Cuff Size: Normal)   Pulse (!) 101   Breastfeeding Yes    Physical Examination:  GENERAL ASSESSMENT: well developed and well nourished SKIN: warm and dry CHEST: normal air exchange, respiratory effort normal with no retractions HEART: regular rate and rhythm ABDOMEN: obese, soft and non-tender, no uterine tenderness GU: normal external genitalia, perineum examined- healing appropriately intact EXTREMITY: no edema, no calf tenderness PSYCH: mood appropriate, normal affect       Assessment:    Postpartum visit  Gestational HTN  Plan:   -reassured pt of normal findings -reviewed perineal care -f/u as scheduled -BP appropriate   Myna Hidalgo, DO Attending Obstetrician & Gynecologist, Faculty Practice Center for [employer], [facility]

## 2022-12-23 ENCOUNTER — Ambulatory Visit: Payer: Self-pay

## 2022-12-23 NOTE — Lactation Note (Signed)
This note was copied from a baby's chart.  NICU Lactation Consultation Note  Patient Name: Joanne [name] [id number] Date: 12/23/2022 Age:22 years  Subjective Reason for consult: Follow-up assessment; 1st time breastfeeding; Primapara; NICU baby; Breast reduction; Maternal endocrine disorder; Other (Comment) (Teen pregnancy)  Visited with family of 26 days old FT NICU female; Joanne [name] is a P1 and reports she's trying to pump every 3 hours but it's been challenging; she also feels discouraged because the amounts she's getting are not matching baby's needs. Explained the importance of consistent pumping to protect her supply; reviewed some strategies to increase supply and advised to contact her provider and ask if an Rx for Reglan would be appropriate. She had a breast reduction in 2021 and that might be the most important contributing factor for the absence of OOL at 12 days post-partum.   Objective Infant data: Mother's Current Feeding Choice: Breast Milk and Formula  Infant feeding assessment Scale for Readiness: 1 Scale for Quality: 2  Maternal data: G1P1001  Vaginal, Vacuum (Extractor) Pumping frequency: 6-7 times/24 hours Pumped volume: 8 mL WIC Program: Yes WIC Referral Sent?: Yes Pump: Personal (WIC pump)  Assessment Infant: In NICU  Maternal: Milk volume: Low  Intervention/Plan Interventions: Breast feeding basics reviewed; DEBP; Education Tools: Pump Pump Education: Setup, frequency, and cleaning; Milk Storage  Plan of care: Encouraged pumping every 3 hours, ideally 8 pumping sessions/24 hours She'll start power pumping in the AM, for at least 1 week  FOB present. All questions and concerns answered, family to contact [facility] services PRN.  Consult Status: NICU follow-up  NICU Follow-up type: Weekly NICU follow up   Joanne [name] 12/23/2022, 12:17 PM

## 2022-12-31 NOTE — BH Specialist Note (Signed)
Integrated Behavioral Health via Telemedicine Visit  01/07/2023 Joanne [name] [id number]  Number of Joanne [name] Clinician visits: 1- Initial Visit  Session Start time: 8115   Session End time: 7262  Total time in minutes: 23   Referring Provider: Loma Boston, DO Patient/Family location: Home [facility] Provider location: [facility] [place] [facility]  All persons participating in visit: Patient Joanne [name] and Joanne [name]   Types of Service: Individual psychotherapy and Video visit  I connected with Joanne [name] and/or Joanne [name]'s  n/a  via  Telephone or Geologist, engineering  (Video is Tree surgeon) and verified that I am speaking with the correct person using two identifiers. Discussed confidentiality: Yes   I discussed the limitations of telemedicine and the availability of in person appointments.  Discussed there is a possibility of technology failure and discussed alternative modes of communication if that failure occurs.  I discussed that engaging in this telemedicine visit, they consent to the provision of behavioral healthcare and the services will be billed under their insurance.  Patient and/or legal guardian expressed understanding and consented to Telemedicine visit: Yes   Presenting Concerns: Patient and/or family reports the following symptoms/concerns: Increased anxiety attributes to adjusting to baby home after  two weeks in NICU; all Sullivan medications managed by a physician (cannot recall name or location); interested in establishing care with ongoing therapy. Pt also concerned about stitches feeling "sore and a slight pinch" with specific movements. Pt is coping with good support at home from family/friends.  Duration of problem: Ongoing ; Severity of problem:  moderately severe  Patient and/or Family's Strengths/Protective Factors: Social connections, Concrete supports in place  (healthy food, safe environments, etc.), and Sense of purpose  Goals Addressed: Patient will:  Reduce symptoms of: anxiety and depression   Increase knowledge and/or ability of: healthy habits   Demonstrate ability to: Increase healthy adjustment to current life circumstances and Increase adequate support systems for patient/family  Progress towards Goals: Ongoing  Interventions: Interventions utilized:  Functional Assessment of ADLs, Psychoeducation and/or Health Education, Link to [employer], and Supportive Reflection Standardized Assessments completed: GAD-7 and PHQ 9  Patient and/or Family Response: Patient agrees with treatment plan.   Assessment: Patient currently experiencing Major depressive disorder, recurrent, moderate and Generalized anxiety disorder.   Patient may benefit from psychoeducation and brief therapeutic interventions regarding coping with symptoms of anxiety and depression .  Plan: Follow up with behavioral health clinician on : Two weeks Behavioral recommendations:  -Continue taking BH medication as prescribed by medical provider -Continue prioritizing healthy self-care (regular meals, adequate rest; allowing practical help from supportive friends and family) until at least postpartum medical appointment -Consider new mom support group as needed at either www.[email or web address].net or [email or web address]  -Continue plan to establish care with ongoing therapy of choice -Discuss pain at stitches site at upcoming medical appointment on 01/22/22; if pain increases, contact ob/gyn provider via MyChart or call. Referral(s): [place] (In Clinic) and [employer]:  new mom support  I discussed the assessment and treatment plan with the patient and/or parent/guardian. They were provided an opportunity to ask questions and all were answered. They agreed with the plan and demonstrated an understanding of the instructions.   They  were advised to call back or seek an in-person evaluation if the symptoms worsen or if the condition fails to improve as anticipated.  Garlan Fair, LCSW     01/07/2023    2:57  PM 11/27/2022   11:51 AM 09/26/2022    9:48 AM 06/03/2022   10:50 AM 04/16/2022   12:20 PM  Depression screen PHQ 2/9  Decreased Interest 2 2 3 2 2   Down, Depressed, Hopeless 2 1 2  0 3  PHQ - 2 Score 4 3 5 2 5   Altered sleeping 1 2 3 1 3   Tired, decreased energy 3 1 3 2 3   Change in appetite 3 2 3 2 3   Feeling bad or failure about yourself  2 0 1 0 1  Trouble concentrating 0 3 3 2 3   Moving slowly or fidgety/restless 0 1 2 1 3   Suicidal thoughts 0 0 0 0 0  PHQ-9 Score 13 12 20 10 21   Difficult doing work/chores     Very difficult      01/07/2023    2:59 PM 11/27/2022   11:52 AM 09/26/2022    9:49 AM 06/03/2022   10:50 AM  GAD 7 : Generalized Anxiety Score  Nervous, Anxious, on Edge 3 3 2 1   Control/stop worrying 3 3 2 1   Worry too much - different things 3 2 2 1   Trouble relaxing 1 2 3 2   Restless 0 1 1 0  Easily annoyed or irritable 3 3 3 3   Afraid - awful might happen 3 1 2  0  Total GAD 7 Score 16 15 15  8

## 2023-01-07 ENCOUNTER — Ambulatory Visit (INDEPENDENT_AMBULATORY_CARE_PROVIDER_SITE_OTHER): Payer: Medicaid Other | Admitting: Clinical

## 2023-01-07 DIAGNOSIS — F411 Generalized anxiety disorder: Secondary | ICD-10-CM

## 2023-01-07 DIAGNOSIS — F331 Major depressive disorder, recurrent, moderate: Secondary | ICD-10-CM | POA: Diagnosis not present

## 2023-01-07 NOTE — Patient Instructions (Signed)
Center for [facility] [place], [place] [address] [phone] (main office) [phone] [facility] office)  New Parent Support Groups www.[email or web address].net [email or web address]  Behavioral Health Resources:   What if I or someone I know is in crisis?  If you are thinking about harming yourself or having thoughts of suicide, or if you know someone who is, seek help right away.  Call your doctor or mental health care provider.  Call 911 or go to a hospital emergency room to get immediate help, or ask a friend or family member to help you do these things; IF YOU ARE IN [place], YOU MAY GO TO WALK-IN URGENT CARE 24/7 at [facility] (see below)  Call the [place] National Suicide Prevention Lifeline's toll-free, 24-hour hotline at 1-800-273-TALK 203-682-5718) or TTY: 1-800-799-4 TTY 564-342-3352) to talk to a trained counselor.  If you are in crisis, make sure you are not left alone.   If someone else is in crisis, make sure he or she is not left alone   24 Hour :   [facility]  [address] [place], [place] [address] [phone] or [place] 24/7  [facility] Mobile Crisis: [phone]  [place] National Suicide Hotline: (902)459-7132  [facility] [employer] (Domestic Violence, [place])  [phone]  [place]  [address], [place]  [address]   [phone] or [phone]   [place]: [phone] (8am-4pm) or [phone] (after hours)        [facility], [address] [place], [place] Fax: [phone] [email or web address] *Interpreters available *Accepts all insurance and uninsured for Urgent Care needs *Accepts Medicaid and uninsured for outpatient  treatment   [facility]   Mon-Fri: 8am-5pm [place], [place], [place]); [phone]) [email or web address]  *Accepts Medicare  [facility] [place], Fri: 8am-4pm [place], [place],  [address] [phone] (phone); [phone] (fax) TaskTown.es  *Accepts Medicare  [facility] Mon-Fri: 9am-5pm  [address] [place], [place] (phone); [phone]  [email or web address]  *Accepts Medicaid  [facility] [place], 8:30am-12pm/1pm-2:30pm [address], [place], [place] (phone); [phone] (fax) www.[email or web address]  *Accepts Medicaid, sliding-scale*Bilingual services available  [facility] Mon-Fri, 8am-7pm [place], [place]  [phone](phone); [phone]) www.[email or web address]  *Accepts Medicaid *Bilingual services available  [facility] Mon-Fri: 8am-5pm, Saturday by appointment only [place], [place], [place] (phone); [phone] (fax) [email or web address]   [facility] [address], [place], [place], [place] [email or web address]  *Free & reduced services for uninsured and underinsured individuals *Bilingual services for Spanish-speaking clients 21 and under  [facility] Mon-Fri: 9am-5:30pm [address], [place], [place], [place] (phone), [phone] (fax) After hours crisis line: [phone] www.[email or web address]  *Accepts Medicare and Medicaid  [facility], 8am-6pm [address] [place], [place] (phone); [phone] (fax) http://[email or web address]  *Subsidized costs available  [facility] Mon-Fri:  8am-4pm [address], [place], [place] [phone](phone); [phone]) www.[email or web address]  *Accepts Medicaid  [facility] Same day access hours: Mon-Fri, 8:30-3pm Crisis hours: Mon-Fri, 8am-5pm [address], [place], [place] (336) [place] Same day access hours: Mon-Fri, 8:30-3pm Crisis hours: Mon-Fri, 8am-8pm [address] [place], [place] (phone); [phone] (fax) www.[email or web address]  *Accepts Medicaid and Medicare  [facility] [place], [place], Fri: 9am-9pm Tues, Thurs: 9am-6pm [place], [place], [place] (phone); [phone] (fax) [email or web address]  *(Accepts Medicare and Medicaid; payment plans available)*Bilingual services available  [facility] [address], [place], [place] (phone); [phone] (fax) [email or web address]   [facility] [address], [place], [place], [place]  [email or web address]  *Bilingual services available  [facility]) Mon-Thurs: 8am-8pm [address], [place], [place], [place] (phone); [phone] (fax) [email or web address]  *Accepts Medicaid*Bilingual services available  [facility] [place] [place], [place] (phone) [email or web address]  *Accepts Medicaid *Bilingual services available  [facility] Mon-Fri, 9am-4:45pm [address] [place], [place] (phone); [phone] (fax) [email or web address]  *[facility] Mon-Thurs: 8:30-8pm, Fri: 8:30am-7pm [address], [place], [place] (3rd floor) [phone] (phone); [phone] (fax) [email or web address]  *Accepts Medicaid; income-based reduced rates available  [facility] Mon-Fri: 8am-5pm [address], [place], [place] [address] [phone] (phone); [phone] (fax) [email or web address]  *Accepts Medicaid*Bilingual services available

## 2023-01-10 NOTE — BH Specialist Note (Signed)
Pt did not arrive to video visit and did not answer the phone; Left HIPPA-compliant message to call back Trany Chernick from [facility] at  [phone] (Jassen Sarver's office).  ?; left MyChart message for patient.  ? ?

## 2023-01-22 ENCOUNTER — Ambulatory Visit (INDEPENDENT_AMBULATORY_CARE_PROVIDER_SITE_OTHER): Payer: Medicaid Other | Admitting: Advanced Practice Midwife

## 2023-01-22 ENCOUNTER — Encounter: Payer: Self-pay | Admitting: Advanced Practice Midwife

## 2023-01-22 VITALS — BP 125/83 | HR 81 | Wt 256.0 lb

## 2023-01-22 DIAGNOSIS — Z3202 Encounter for pregnancy test, result negative: Secondary | ICD-10-CM | POA: Diagnosis not present

## 2023-01-22 DIAGNOSIS — Z30017 Encounter for initial prescription of implantable subdermal contraceptive: Secondary | ICD-10-CM | POA: Insufficient documentation

## 2023-01-22 LAB — POCT URINE PREGNANCY: Preg Test, Ur: NEGATIVE

## 2023-01-22 MED ORDER — ETONOGESTREL 68 MG ~~LOC~~ IMPL
68.0000 mg | DRUG_IMPLANT | Freq: Once | SUBCUTANEOUS | Status: AC
  Administered 2023-01-22: 68 mg via SUBCUTANEOUS

## 2023-01-22 NOTE — Progress Notes (Signed)
POSTPARTUM VISIT Patient name: Joanne [name] MRN [id number]  Date of birth: [birth year] Chief Complaint:   Postpartum Care  History of Present Illness:   Joanne [name] is a 22 y.o. G78P1001 Caucasian female being seen today for a postpartum visit. She is 6 weeks postpartum following a vacuum, low at 39.4 gestational weeks. IOL: yes, for gestational hypertension . Anesthesia: epidural.  Laceration: 1st degree perineal.  Complications: infant to NICU (Apgars 1/1/2) for resp & seizure concerns. Inpatient contraception: no.   Pregnancy complicated by morbid obesity, hypothyroidism, dx of gHTN at 38.4 wks . Tobacco use: former . Substance use disorder: no. Last pap smear: <21yo and results were N/A. Next pap smear due: May 2025 No LMP recorded.  Postpartum course has been complicated by initial difficulty w comfort due to lac repair . Bleeding none. Bowel function is  some constipation/some diarrhea . Bladder function is normal. Urinary incontinence? no, fecal incontinence? no Patient is not sexually active. Last sexual activity: prior to birth of baby. Desired contraception: Nexplanon. Patient does not want a pregnancy in the future.  Desired family size is 1 children.   Upstream - 01/22/23 1111       Pregnancy Intention Screening   Does the patient want to become pregnant in the next year? No    Does the patient's partner want to become pregnant in the next year? No    Would the patient like to discuss contraceptive options today? Yes      Contraception Wrap Up   Current Method Abstinence    End Method Hormonal Implant    Contraception Counseling Provided Yes            The pregnancy intention screening data noted above was reviewed. Potential methods of contraception were discussed. The patient elected to proceed with Hormonal Implant.  Edinburgh Postpartum Depression Screening: positive: H/O mental health disorder: yes hx dep/anxiety. Currently on meds: no.  Currently in therapy: no.   Sleeping: as expected w newborn.  Appetite: nl.  Still finds joy in things she used to: Yes.  Support at home: yes- states she has a lot of support.  SI/HI/II: yes says she has no plan, and would only do this if extremely depressed, which she denies; states she is mostly anxious now due to infant having NICU stay.  Interested in medicine: No.  Interested in therapy: No., but wants to continue meeting w Joanne [name] prn  Edinburgh Postnatal Depression Scale - 01/22/23 1111       Edinburgh Postnatal Depression Scale:  In the Past 7 Days   I have been able to laugh and see the funny side of things. 2    I have looked forward with enjoyment to things. 2    I have blamed myself unnecessarily when things went wrong. 3    I have been anxious or worried for no good reason. 3    I have felt scared or panicky for no good reason. 3    Things have been getting on top of me. 2    I have been so unhappy that I have had difficulty sleeping. 1    I have felt sad or miserable. 2    I have been so unhappy that I have been crying. 1    The thought of harming myself has occurred to me. 2    Edinburgh Postnatal Depression Scale Total 21                01/07/2023    2:59  PM 11/27/2022   11:52 AM 09/26/2022    9:49 AM 06/03/2022   10:50 AM  GAD 7 : Generalized Anxiety Score  Nervous, Anxious, on Edge 3 3 2 1   Control/stop worrying 3 3 2 1   Worry too much - different things 3 2 2 1   Trouble relaxing 1 2 3 2   Restless 0 1 1 0  Easily annoyed or irritable 3 3 3 3   Afraid - awful might happen 3 1 2  0  Total GAD 7 Score 16 15 15 8      Baby's course has been complicated by 2wk NICU stay, resp and seizure concerns . Baby is feeding by breast and bottle: milk supply inadequate . Infant has a pediatrician/family doctor? Yes.  Childcare strategy if returning to work/school: n/a-stay at home mom.  Pt has material needs met for her and baby: Yes.   Review of Systems:   Pertinent items are noted in HPI Denies Abnormal  vaginal discharge w/ itching/odor/irritation, headaches, visual changes, shortness of breath, chest pain, abdominal pain, severe nausea/vomiting, or problems with urination or bowel movements. Pertinent History Reviewed:  Reviewed past medical,surgical, obstetrical and family history.  Reviewed problem list, medications and allergies. OB History  Gravida Para Term Preterm AB Living  1 1 1     1   SAB IAB Ectopic Multiple Live Births        0 1    # Outcome Date GA Lbr Len/2nd Weight Sex Delivery Anes PTL Lv  1 Term 12/11/22 [redacted]w[redacted]d 15:23 / 03:40 6 lb 15.5 oz (3.16 kg) M Vag-Vacuum EPI  LIV   Physical Assessment:   Vitals:   01/22/23 1106  BP: 125/83  Pulse: 81  Weight: 256 lb (116.1 kg)  Body mass index is 45.35 kg/m.       Physical Examination:   General appearance: alert, well appearing, and in no distress  Mental status: alert, oriented to person, place, and time  Skin: warm & dry   Cardiovascular: normal heart rate noted   Respiratory: normal respiratory effort, no distress   Breasts: deferred, no complaints   Abdomen: soft, non-tender   Pelvic: examination not indicated. Thin prep pap obtained: No  Rectal: not examined  Extremities: Edema: none         Results for orders placed or performed in visit on 01/22/23 (from the past 24 hour(s))  POCT urine pregnancy   Collection Time: 01/22/23 11:24 AM  Result Value Ref Range   Preg Test, Ur Negative Negative     NEXPLANON INSERTION  Risks/benefits/side effects of Nexplanon have been discussed and her questions have been answered.  Specifically, a failure rate of 12/998 has been reported, with an increased failure rate if pt takes [employer] and/or antiseizure medicaitons.  She is aware of the common side effect of irregular bleeding, which the incidence of decreases over time. Signed copy of informed consent in chart.   Time out was performed.  She is right-handed, so her left arm, approximately 10cm from the medial  epicondyle and 3-5cm posterior to the sulcus, was cleansed with alcohol and anesthetized with 2cc of 2% Lidocaine.  The area was cleansed again with betadine and the Nexplanon was inserted per manufacturer's recommendations without difficulty.  3 steri-strips and pressure bandage were applied. The patient tolerated the procedure well.   Assessment & Plan:  1) Postpartum exam 2) Six wks s/p vacuum, low 3) breast & bottle feeding 4) Depression screening- severe; declines meds; starts w PCP in April  and will continue to speak w Joanne [name] prn 5) Nexplanon insertion Pt was instructed to keep the area clean and dry, remove pressure bandage in 24 hours, and keep insertion site covered with the steri-strip for 3-5 days.  Condoms for 2 weeks.  She was given a card indicating date Nexplanon was inserted and date it needs to be removed. Follow-up PRN problems. LOT# [id number]    EXP: 2025-11  6) Hx gHTN, stable BP now, did not require PP antihypertensives 7) Hypothyroidism, Synthroid 55mcg; PCP to follow  Essential components of care per ACOG recommendations:  1.  Mood and well being:  If positive depression screen, discussed and plan developed.  If using tobacco we discussed reduction/cessation and risk of relapse If current substance abuse, we discussed and referral to local resources was offered.   2. Infant care and feeding:  If breastfeeding, discussed returning to work, pumping, breastfeeding-associated pain, guidance regarding return to fertility while lactating if not using another method. If needed, patient was provided with a letter to be allowed to pump q 2-3hrs to support lactation in a private location with access to a refrigerator to store breastmilk.   Recommended that all caregivers be immunized for flu, pertussis and other preventable communicable diseases If pt does not have material needs met for her/baby, referred to local resources for help obtaining these.  3. Sexuality, contraception  and birth spacing Provided guidance regarding sexuality, management of dyspareunia, and resumption of intercourse Discussed avoiding interpregnancy interval <41mths and recommended birth spacing of 18 months  4. Sleep and fatigue Discussed coping options for fatigue and sleep disruption Encouraged family/partner/community support of 4 hrs of uninterrupted sleep to help with mood and fatigue  5. Physical recovery  If pt had a C/S, assessed incisional pain and providing guidance on normal vs prolonged recovery If pt had a laceration, perineal healing and pain reviewed.  If urinary or fecal incontinence, discussed management and referred to PT or uro/gyn if indicated  Patient is safe to resume physical activity. Discussed attainment of healthy weight.  6.  Chronic disease management Discussed pregnancy complications if any, and their implications for future childbearing and long-term maternal health. Review recommendations for prevention of recurrent pregnancy complications, such as 17 hydroxyprogesterone caproate to reduce risk for recurrent PTB not applicable, or aspirin to reduce risk of preeclampsia yes. Pt had GDM: No. If yes, 2hr GTT scheduled: not applicable. Reviewed medications and non-pregnant dosing including consideration of whether pt is breastfeeding using a reliable resource such as LactMed: yes Referred for f/u w/ PCP or subspecialist providers as indicated: starting care w Dr Thersa Salt in April  7. Health maintenance Mammogram at 20yo or earlier if indicated Pap smears as indicated  Meds: No orders of the defined types were placed in this encounter.   Follow-up: Return in about 1 year (around 01/23/2024) for Physical.   Orders Placed This Encounter  Procedures   POCT urine pregnancy    Myrtis Ser [facility] 01/22/2023 11:42 AM

## 2023-01-22 NOTE — Patient Instructions (Signed)
Use the site www.postpartum.net for postpartum depression/anxiety resources!

## 2023-01-23 ENCOUNTER — Ambulatory Visit: Payer: Medicaid Other | Admitting: Clinical

## 2023-01-23 DIAGNOSIS — Z91199 Patient's noncompliance with other medical treatment and regimen due to unspecified reason: Secondary | ICD-10-CM

## 2023-02-25 ENCOUNTER — Other Ambulatory Visit: Payer: Self-pay | Admitting: Family Medicine

## 2023-03-31 ENCOUNTER — Ambulatory Visit: Payer: Medicaid Other | Admitting: Family Medicine

## 2023-04-07 ENCOUNTER — Ambulatory Visit: Payer: Medicaid Other | Admitting: Family Medicine

## 2023-05-14 ENCOUNTER — Ambulatory Visit: Payer: Medicaid Other | Admitting: Family Medicine

## 2023-05-16 ENCOUNTER — Encounter: Payer: Self-pay | Admitting: *Deleted

## 2023-09-25 ENCOUNTER — Encounter: Payer: 59 | Admitting: Women's Health
